# Patient Record
Sex: Male | Born: 1975 | Race: White | Hispanic: No | Marital: Married | Smoking: Former smoker
Health system: Southern US, Community
[De-identification: ages and names within clinical notes are randomized; demographics above are authoritative.]

## PROBLEM LIST (undated history)

## (undated) DIAGNOSIS — M5136 Other intervertebral disc degeneration, lumbar region: Secondary | ICD-10-CM

## (undated) DIAGNOSIS — Z8619 Personal history of other infectious and parasitic diseases: Secondary | ICD-10-CM

## (undated) HISTORY — DX: Personal history of other infectious and parasitic diseases: Z86.19

## (undated) HISTORY — PX: SKIN BIOPSY: SHX1

---

## 2010-03-18 ENCOUNTER — Ambulatory Visit: Payer: Self-pay | Admitting: Family Medicine

## 2010-09-18 NOTE — Assessment & Plan Note (Signed)
Summary: WELLNESS CHECK/EVM   Vital Signs:  Patient Profile:   35 Years Old Male CC:      wellness check Height:     71 inches Weight:      168 pounds Temp:     98.7 degrees F oral Pulse rate:   76 / minute Pulse rhythm:   regular Resp:     16 per minute BP sitting:   114 / 64  (left arm) Cuff size:   regular  Vitals Entered By: Providence Crosby LPN (March 18, 2010 1:03 PM)              Vision Screening: Left eye w/o correction: 20 / 25 Right Eye w/o correction: 20 / 20 Both eyes w/o correction:  20/ 20  Color vision testing: normal      Vision Entered By: Providence Crosby LPN (March 18, 2010 1:09 PM) Audiometry Screening     20db HL: Yes    Left  500 hz: 20db 1000 hz: 20db 2000 hz: 20db 4000 hz: 20db Right  500 hz: 20db 1000 hz: 20db 2000 hz: 20db 4000 hz: 20db   Hearing Testing Entered By: Providence Crosby LPN (March 18, 2010 1:09 PM)    Current Allergies: No known allergies History of Present Illness Reason for visit: wellness check Chief Complaint: wellness check History of Present Illness: wants physical only complaint is some left ear pain.  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 He works for [employer].  He feels well, eats healthy, stays active, and has good family history of good health.  REVIEW OF SYSTEMS Constitutional Symptoms      Denies fever, chills, night sweats, weight loss, weight gain, and fatigue.  Eyes       Denies change in vision, eye pain, eye discharge, glasses, contact lenses, and eye surgery. Ear/Nose/Throat/Mouth       Complains of ear pain.      Denies hearing loss/aids, change in hearing, ear discharge, dizziness, frequent runny nose, frequent nose bleeds, sinus problems, sore throat, hoarseness, and tooth pain or bleeding.      Comments: left ear pain at times, He had frequent infections as child. He is sensitive to loud noises. Respiratory       Denies dry cough, productive  cough, wheezing, shortness of breath, asthma, bronchitis, and emphysema/COPD.  Cardiovascular       Denies murmurs, chest pain, and tires easily with exhertion.    Gastrointestinal       Denies stomach pain, nausea/vomiting, diarrhea, constipation, blood in bowel movements, and indigestion. Genitourniary       Denies painful urination, kidney stones, and loss of urinary control. Neurological       Denies paralysis, seizures, and fainting/blackouts. Musculoskeletal       Denies muscle pain, joint pain, joint stiffness, decreased range of motion, redness, swelling, muscle weakness, and gout.  Skin       Denies bruising, unusual mles/lumps or sores, and hair/skin or nail changes.  Psych       Denies mood changes, temper/anger issues, anxiety/stress, speech problems, depression, and sleep problems.  Past History:  Past Medical History: Fractured left forearm as child, and no problems now.  Family History: Father:  alive and well at 41 yoa Mother:alive and well at 25 yoa  Siblings: 2 brothers Thayer Ohm 63 yoa alive and well: geoff age 5 alive and well 1 Sister 52 yoa alive and well  Social History: Marital Status: Married Children: none Occupation:  Psychologist, clinical Married Never Smoked Alcohol use-yes Drug use-no Regular exercise-yes, with work, and staying actice, walking dogs, etc. Smoking Status:  never Drug Use:  no Does Patient Exercise:  yes Physical Exam General appearance: well developed, well nourished, no acute distress Head: normocephalic, atraumatic Eyes: conjunctivae and lids normal Pupils: equal, round, reactive to light Ears: normal, no lesions or deformities on right. On left, small red inflamed area mid portion of canal superiorly-like mild pusture, with mild amout cerumen around it.  TM with scarring inferiorly. Nasal: mucosa pink, nonedematous, no septal deviation, turbinates normal Oral/Pharynx: tongue normal, posterior pharynx without erythema  or exudate. Good dental care, with fillings noted. Neck: neck supple,  trachea midline, no masses Thyroid: no nodules, masses, tenderness, or enlargement Chest/Lungs: no rales, wheezes, or rhonchi bilateral, breath sounds equal without effort Heart: regular rate and  rhythm, no murmur Abdomen: soft, non-tender without obvious organomegaly GU: normal male, no hernia. Normal testicles. Prostate exam deferred. Extremities: normal extremities Neurological: grossly intact and non-focal Back: no tenderness over musculature, straight leg raises negative bilaterally, deep tendon reflexes 2+ at achilles and patella Skin: no obvious rashes or lesions MSE: oriented to time, place, and person Assessment New Problems: EXAMINATION, ROUTINE MEDICAL (ICD-V70.0)     Patient Instructions: 1)  CMP, Lipid Panel,CBC to be done soon, fasting 2)  Urine-dip to do later today.  Orders Added: 1)  New Patient Level IV [99204]  The patient was informed that there is no on-call provider or services available at this clinic during off-hours (when the clinic is closed).  If the patient developed a problem or concern that required immediate attention, the patient was advised to go the the nearest available urgent care or emergency department for medical care.  The patient verbalized understanding.     It was clearly explained to the patient that this [facility] is not intended to be a primary care clinic.  The patient is always better served by the continuity of care and the provider/patient relationships developed with their dedicated primary care provider.  The patient was told to be sure to follow up as soon as possible with their primary care provider to discuss treatments received and to receive further examination and testing.  The patient verbalized understanding. The will f/u with PCP ASAP.   I have reviewed the above medical office visit documention, including diagnoses, history, medications, clinical  lists, orders and plan of care.   Rodney Langton, MD, FAAFP  March 19, 2010

## 2011-07-23 ENCOUNTER — Emergency Department (HOSPITAL_BASED_OUTPATIENT_CLINIC_OR_DEPARTMENT_OTHER)
Admission: EM | Admit: 2011-07-23 | Discharge: 2011-07-23 | Disposition: A | Discharge status: Home / Self Care | Payer: 59 | Attending: Emergency Medicine | Admitting: Emergency Medicine

## 2011-07-23 DIAGNOSIS — W57XXXA Bitten or stung by nonvenomous insect and other nonvenomous arthropods, initial encounter: Secondary | ICD-10-CM

## 2011-07-23 DIAGNOSIS — L03116 Cellulitis of left lower limb: Secondary | ICD-10-CM

## 2011-07-23 DIAGNOSIS — S90569A Insect bite (nonvenomous), unspecified ankle, initial encounter: Secondary | ICD-10-CM | POA: Insufficient documentation

## 2011-07-23 MED ORDER — DOXYCYCLINE HYCLATE 100 MG PO TABS
ORAL_TABLET | ORAL | Status: AC
  Administered 2011-07-23: 100 mg via ORAL
  Filled 2011-07-23: qty 1

## 2011-07-23 MED ORDER — DOXYCYCLINE HYCLATE 100 MG PO CAPS: 100.0000 mg | ORAL_CAPSULE | Freq: Two times a day (BID) | ORAL | Status: AC

## 2011-07-23 MED ORDER — DOXYCYCLINE HYCLATE 100 MG PO TABS
100.0000 mg | ORAL_TABLET | Freq: Once | ORAL | Status: AC
  Administered 2011-07-23: 100 mg via ORAL

## 2011-07-23 NOTE — ED Notes (Signed)
Pt reports he pulled a tick from his left thigh yesterday.  Left thigh red and tender to touch.

## 2011-07-23 NOTE — ED Provider Notes (Signed)
History     CSN: [id number] Arrival date & time: 07/23/2011  3:14 PM   First MD Initiated Contact with Patient 07/23/11 1511      Chief Complaint  Patient presents with  . Tick Removal    HPI The patient reports take by with removal of the tick by himself yesterday.  He presents today complaining of a small amount of redness surrounding the area with tenderness to the touch and warmth.  He feels certain he got all the tick however he does note a small black area in the middle.  His had a fever and chills he is otherwise a healthy male.  Nothing worsens the symptoms nothing improves his symptoms.    History reviewed. No pertinent past medical history.  History reviewed. No pertinent past surgical history.  No family history on file.  History  Substance Use Topics  . Smoking status: Never Smoker   . Smokeless tobacco: Never Used  . Alcohol Use: Yes     weekly      Review of Systems  All other systems reviewed and are negative.    Allergies  Review of patient's allergies indicates no known allergies.  Home Medications   Current Outpatient Rx  Name Route Sig Dispense Refill  . DOXYCYCLINE HYCLATE 100 MG PO CAPS Oral Take 1 capsule (100 mg total) by mouth 2 (two) times daily. 14 capsule 0    BP 138/89  Pulse 78  Temp(Src) 98.7 F (37.1 C) (Oral)  Resp 16  Ht 6' (1.829 m)  Wt 175 lb (79.379 kg)  BMI 23.73 kg/m2  SpO2 100%  Physical Exam  Constitutional: He is oriented to person, place, and time. He appears well-developed and well-nourished.  HENT:  Head: Normocephalic.  Eyes: EOM are normal.  Neck: Normal range of motion.  Pulmonary/Chest: Effort normal.  Musculoskeletal: Normal range of motion.       Proximally 4 cm x 2 cm area of erythema on his left mid anterior medial thigh.  There is a small area of black in the middle that appears to be the head of the tick which was removed without difficulty.  Is no fluctuance or drainage from the area.  There is a  small amount of warmth and tenderness to this area.  It was outlined with a skin marker  Neurological: He is alert and oriented to person, place, and time.  Psychiatric: He has a normal mood and affect.    ED Course  Procedures (including critical care time)  Labs Reviewed - No data to display No results found.   1. Tick bite   2. Cellulitis of left lower extremity       MDM  I removed what appeared to be the tick head.  The patient was given doxycycline in the emergency department.  He'll be treated with a week's worth of doxycycline for suspected cellulitis.  He has no symptoms of RMSF at this time,. However doxy should cover that as well.         Lyanne Co, MD 07/23/11 [phone]

## 2012-03-05 ENCOUNTER — Encounter: Payer: Self-pay | Admitting: Internal Medicine

## 2012-03-05 ENCOUNTER — Ambulatory Visit (INDEPENDENT_AMBULATORY_CARE_PROVIDER_SITE_OTHER): Payer: 59 | Admitting: Internal Medicine

## 2012-03-05 VITALS — BP 102/82 | HR 76 | Temp 98.3°F | Ht 72.0 in | Wt 173.0 lb

## 2012-03-05 DIAGNOSIS — R6882 Decreased libido: Secondary | ICD-10-CM

## 2012-03-05 DIAGNOSIS — R1013 Epigastric pain: Secondary | ICD-10-CM

## 2012-03-05 DIAGNOSIS — Z13220 Encounter for screening for lipoid disorders: Secondary | ICD-10-CM

## 2012-03-05 DIAGNOSIS — G4700 Insomnia, unspecified: Secondary | ICD-10-CM

## 2012-03-05 DIAGNOSIS — Z8669 Personal history of other diseases of the nervous system and sense organs: Secondary | ICD-10-CM

## 2012-03-05 DIAGNOSIS — K3189 Other diseases of stomach and duodenum: Secondary | ICD-10-CM

## 2012-03-05 NOTE — Patient Instructions (Addendum)
I recommend a trial of OTC prilosec (omeprazole) once daily in the morning when you get up for your gastritis  Try taking Lactase with any meal containing dairy .  Return in a fasting state for your labs. (testoserone, lipids. CMT. CBC TSH)
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
## 2012-03-07 ENCOUNTER — Encounter: Payer: Self-pay | Admitting: Internal Medicine

## 2012-03-07 DIAGNOSIS — G4700 Insomnia, unspecified: Secondary | ICD-10-CM | POA: Insufficient documentation

## 2012-03-07 DIAGNOSIS — R1013 Epigastric pain: Secondary | ICD-10-CM | POA: Insufficient documentation

## 2012-03-07 DIAGNOSIS — Z8669 Personal history of other diseases of the nervous system and sense organs: Secondary | ICD-10-CM | POA: Insufficient documentation

## 2012-03-07 DIAGNOSIS — R6882 Decreased libido: Secondary | ICD-10-CM | POA: Insufficient documentation

## 2012-03-07 NOTE — Assessment & Plan Note (Addendum)
Etiology may be food intolerances.  Discussed possibility of lactose intolerance  and gluten sensitivity.  uecommended trial of omeprazole 20 mg daily  and use of lactase with any meals containing dairy.

## 2012-03-07 NOTE — Assessment & Plan Note (Signed)
Records not available but it sounds like he may have been treated for otitis externa prednisone and topical ointment. Examination of the tympanic membrane today shows some scarring but no evidence of erythema or effusion.

## 2012-03-07 NOTE — Assessment & Plan Note (Signed)
Etiology unclear; he has a history of borderline low testosterone in the past with no followup.. Checking thyroid and testosterone levels and  liver function.

## 2012-03-07 NOTE — Assessment & Plan Note (Addendum)
Chronic, now managed with melatonin 10 mg daily. He is following all good sleep hygiene rules. There is no history of snoring. No changes today.

## 2012-03-09 ENCOUNTER — Other Ambulatory Visit (INDEPENDENT_AMBULATORY_CARE_PROVIDER_SITE_OTHER): Payer: 59 | Admitting: *Deleted

## 2012-03-09 DIAGNOSIS — K3189 Other diseases of stomach and duodenum: Secondary | ICD-10-CM

## 2012-03-09 DIAGNOSIS — R6882 Decreased libido: Secondary | ICD-10-CM

## 2012-03-09 DIAGNOSIS — Z13220 Encounter for screening for lipoid disorders: Secondary | ICD-10-CM

## 2012-03-09 DIAGNOSIS — R1013 Epigastric pain: Secondary | ICD-10-CM

## 2012-03-09 LAB — COMPREHENSIVE METABOLIC PANEL
Albumin: 4.6 g/dL (ref 3.5–5.2)
BUN: 14 mg/dL (ref 6–23)
Calcium: 9.2 mg/dL (ref 8.4–10.5)
Chloride: 102 mEq/L (ref 96–112)
Glucose, Bld: 85 mg/dL (ref 70–99)
Potassium: 4.8 mEq/L (ref 3.5–5.3)

## 2012-03-09 LAB — LIPID PANEL
Cholesterol: 109 mg/dL (ref 0–200)
Triglycerides: 39 mg/dL (ref <150)
VLDL: 8 mg/dL (ref 0–40)

## 2012-03-09 LAB — CBC WITH DIFFERENTIAL/PLATELET
Hemoglobin: 15.6 g/dL (ref 13.0–17.0)
Lymphs Abs: 1.5 10*3/uL (ref 0.7–4.0)
Monocytes Relative: 9 % (ref 3–12)
Neutro Abs: 3.6 10*3/uL (ref 1.7–7.7)
Neutrophils Relative %: 64 % (ref 43–77)
RBC: 5.12 MIL/uL (ref 4.22–5.81)
WBC: 5.7 10*3/uL (ref 4.0–10.5)

## 2012-03-10 LAB — TESTOSTERONE, FREE, TOTAL, SHBG
Sex Hormone Binding: 18 nmol/L (ref 13–71)
Testosterone, Free: 97 pg/mL (ref 47.0–244.0)

## 2012-03-23 ENCOUNTER — Telehealth: Payer: Self-pay | Admitting: Internal Medicine

## 2012-03-23 NOTE — Telephone Encounter (Signed)
Patient is calling to check on his h.pylori and testosterone results that were pending when I called him on 7/23.  Please advise.

## 2012-03-23 NOTE — Telephone Encounter (Signed)
Pt called to check on labs results

## 2012-03-23 NOTE — Telephone Encounter (Signed)
His H pylori was negative and his testosterone levels were normal.  wi will try to e mail them later

## 2012-03-31 ENCOUNTER — Telehealth: Payer: Self-pay | Admitting: Internal Medicine

## 2012-03-31 NOTE — Telephone Encounter (Signed)
Patient notified of results.

## 2012-03-31 NOTE — Telephone Encounter (Signed)
Pt called wants to get results of his last labs Please advise

## 2012-05-05 ENCOUNTER — Encounter: Payer: Self-pay | Admitting: Internal Medicine

## 2012-10-03 ENCOUNTER — Other Ambulatory Visit: Payer: Self-pay

## 2013-04-30 ENCOUNTER — Encounter: Payer: Self-pay | Admitting: Internal Medicine

## 2013-04-30 ENCOUNTER — Ambulatory Visit (INDEPENDENT_AMBULATORY_CARE_PROVIDER_SITE_OTHER): Payer: 59 | Admitting: Internal Medicine

## 2013-04-30 VITALS — BP 122/76 | HR 88 | Temp 98.4°F | Resp 14 | Ht 72.0 in | Wt 172.5 lb

## 2013-04-30 DIAGNOSIS — S90852A Superficial foreign body, left foot, initial encounter: Secondary | ICD-10-CM

## 2013-04-30 DIAGNOSIS — M7020 Olecranon bursitis, unspecified elbow: Secondary | ICD-10-CM

## 2013-04-30 DIAGNOSIS — M25529 Pain in unspecified elbow: Secondary | ICD-10-CM

## 2013-04-30 DIAGNOSIS — M7021 Olecranon bursitis, right elbow: Secondary | ICD-10-CM

## 2013-04-30 DIAGNOSIS — IMO0002 Reserved for concepts with insufficient information to code with codable children: Secondary | ICD-10-CM

## 2013-04-30 DIAGNOSIS — G8929 Other chronic pain: Secondary | ICD-10-CM

## 2013-04-30 MED ORDER — TRAMADOL HCL 50 MG PO TABS: 50.0000 mg | ORAL_TABLET | Freq: Three times a day (TID) | ORAL | Status: DC | PRN

## 2013-04-30 MED ORDER — CEPHALEXIN 500 MG PO CAPS: 500.0000 mg | ORAL_CAPSULE | Freq: Four times a day (QID) | ORAL | Status: DC

## 2013-04-30 MED ORDER — DICLOFENAC SODIUM ER 100 MG PO TB24: 100.0000 mg | ORAL_TABLET | Freq: Every day | ORAL | Status: DC

## 2013-04-30 NOTE — Patient Instructions (Signed)
voltaren once daily (instead of aleve and ibuprofren)  Tramadol every 8 hours as needed  X rays elbow    Soak foot in warm salt water for 15 minutes once or twice daily  keep wound covered  Start antibiotics if you develop any redness or discharge and call office to be worked in
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
## 2013-05-02 ENCOUNTER — Encounter: Payer: Self-pay | Admitting: Internal Medicine

## 2013-05-02 DIAGNOSIS — M7030 Other bursitis of elbow, unspecified elbow: Secondary | ICD-10-CM | POA: Insufficient documentation

## 2013-05-02 DIAGNOSIS — S90852A Superficial foreign body, left foot, initial encounter: Secondary | ICD-10-CM | POA: Insufficient documentation

## 2013-05-02 NOTE — Assessment & Plan Note (Signed)
No specific trigger,  With prior remote injury dure to traumatic blow.  Plain films ,  NSAIDs .  If no improvement steroid injection

## 2013-05-02 NOTE — Assessment & Plan Note (Signed)
Patient reports persistent pain and irritation on the first metatarsal head after donning a sock that his labrador had chewed on and was concerned that he had a dog hair (or whisker) embedded in skin. Area was incised with scalpel and probed   No foreign body was found.,  patient instructed to soak foot in warm salt water and keep covered.  rx for keflex given for signs of redness, discharge indicating infection

## 2013-05-03 ENCOUNTER — Encounter: Payer: Self-pay | Admitting: Internal Medicine

## 2013-05-03 ENCOUNTER — Ambulatory Visit (INDEPENDENT_AMBULATORY_CARE_PROVIDER_SITE_OTHER)
Admission: RE | Admit: 2013-05-03 | Discharge: 2013-05-03 | Disposition: A | Discharge status: Home / Self Care | Payer: 59 | Source: Ambulatory Visit | Attending: Internal Medicine | Admitting: Internal Medicine

## 2013-05-03 DIAGNOSIS — G8929 Other chronic pain: Secondary | ICD-10-CM

## 2013-05-03 DIAGNOSIS — M25529 Pain in unspecified elbow: Secondary | ICD-10-CM

## 2013-05-05 NOTE — Telephone Encounter (Signed)
Mailed unread message to pt  

## 2013-06-24 ENCOUNTER — Other Ambulatory Visit: Payer: Self-pay

## 2014-09-30 ENCOUNTER — Ambulatory Visit (INDEPENDENT_AMBULATORY_CARE_PROVIDER_SITE_OTHER)
Admission: RE | Admit: 2014-09-30 | Discharge: 2014-09-30 | Disposition: A | Discharge status: Home / Self Care | Payer: Managed Care, Other (non HMO) | Source: Ambulatory Visit | Attending: Nurse Practitioner | Admitting: Nurse Practitioner

## 2014-09-30 ENCOUNTER — Encounter: Payer: Self-pay | Admitting: Nurse Practitioner

## 2014-09-30 ENCOUNTER — Ambulatory Visit (INDEPENDENT_AMBULATORY_CARE_PROVIDER_SITE_OTHER): Payer: Managed Care, Other (non HMO) | Admitting: Nurse Practitioner

## 2014-09-30 ENCOUNTER — Ambulatory Visit (HOSPITAL_COMMUNITY): Payer: Managed Care, Other (non HMO)

## 2014-09-30 VITALS — BP 132/88 | HR 82 | Temp 97.8°F | Resp 12 | Ht 72.0 in | Wt 177.8 lb

## 2014-09-30 DIAGNOSIS — M5416 Radiculopathy, lumbar region: Secondary | ICD-10-CM

## 2014-09-30 MED ORDER — GABAPENTIN 300 MG PO CAPS: 300.0000 mg | ORAL_CAPSULE | Freq: Every day | ORAL | Status: DC

## 2014-09-30 NOTE — Assessment & Plan Note (Signed)
Stable. Pt would like to continue the ibuprofen instead of something like Meloxicam or diclofenac. Rx for gabapentin 300 mg at night. [facility] FU in 2 weeks.

## 2014-09-30 NOTE — Progress Notes (Signed)
Pre visit review using our clinic review tool, if applicable. No additional management support is needed unless otherwise documented below in the visit note. 

## 2014-09-30 NOTE — Patient Instructions (Signed)
Follow-up in 2 weeks

## 2014-09-30 NOTE — Progress Notes (Signed)
Subjective:    Patient ID: Colton Morris, male    DOB: 09-Aug-1976, 39 y.o.   MRN: [id number]  HPI  Colton Morris is a 5 male with a CC of back pain x 1 month.    1) Back pain stable since approx. 15 years.  Splitting fire wood in November felt pain on left.  Month ago felt pain going down left leg   Shooting pain down left leg and constant  Pain on left leg is posterior and lateral down to posterior heel and stops Relieving- Bending leg, lying down  Aggrevating- standing and sitting more than 30 min.  Stretching- Helpful Massages- Helpful Foot elevated - Helpful  Ibuprofen- helpful somewhat    Review of Systems  Constitutional: Negative for fever, chills, diaphoresis, activity change and fatigue.  Eyes: Negative for visual disturbance.  Gastrointestinal: Negative for nausea, vomiting, diarrhea and rectal pain.  Genitourinary: Negative for dysuria and difficulty urinating.  Musculoskeletal: Positive for back pain and arthralgias. Negative for myalgias, joint swelling, gait problem, neck pain and neck stiffness.       Lower back bilateral, left side worse, pain down left leg  Skin: Negative for rash.  Neurological: Negative for dizziness, weakness and numbness.       Denies losing bowel/bladder function, saddle anesthesia, or weakness of extremities.    Past Medical History  Diagnosis Date  . History of chicken pox     History   Social History  . Marital Status: Married    Spouse Name: N/A  . Number of Children: N/A  . Years of Education: 16   Occupational History  . Facility/Property Manager Other   Social History Main Topics  . Smoking status: Never Smoker   . Smokeless tobacco: Never Used  . Alcohol Use: 3.0 oz/week    6 drink(s) per week     Comment: weekly  . Drug Use: No  . Sexual Activity: Yes   Other Topics Concern  . Not on file   Social History Narrative   Regular exercise-yes   Caffeine Use-yes          No past surgical history on  file.  Family History  Problem Relation Age of Onset  . Cancer Neg Hx     No Known Allergies  No current outpatient prescriptions on file prior to visit.   No current facility-administered medications on file prior to visit.       Objective:   Physical Exam  Constitutional: He is oriented to person, place, and time. He appears well-developed and well-nourished. No distress.  BP 132/88 mmHg  Pulse 82  Temp(Src) 97.8 F (36.6 C) (Oral)  Resp 12  Ht 6' (1.829 m)  Wt 177 lb 12.8 oz (80.65 kg)  BMI 24.11 kg/m2  SpO2 97%   HENT:  Head: Normocephalic and atraumatic.  Right Ear: External ear normal.  Left Ear: External ear normal.  Eyes: Right eye exhibits no discharge. Left eye exhibits no discharge. No scleral icterus.  Cardiovascular: Normal rate, regular rhythm, normal heart sounds and intact distal pulses.  Exam reveals no gallop and no friction rub.   No murmur heard. Pulmonary/Chest: Effort normal and breath sounds normal. No respiratory distress. He has no wheezes. He has no rales. He exhibits no tenderness.  Musculoskeletal: Normal range of motion. He exhibits tenderness. He exhibits no edema.  Left lower back L4 area paraspinal muscle tenderness  Neurological: He is alert and oriented to person, place, and time. He displays normal reflexes. No cranial  nerve deficit. He exhibits normal muscle tone. Coordination normal.  Negative straight leg raise left   Skin: Skin is warm and dry. No rash noted. He is not diaphoretic.  Psychiatric: He has a normal mood and affect. His behavior is normal. Judgment and thought content normal.      Assessment & Plan:

## 2014-10-05 ENCOUNTER — Telehealth: Payer: Self-pay | Admitting: Internal Medicine

## 2014-10-05 NOTE — Telephone Encounter (Signed)
Patient calling for results of X-Ray from 09/30/14 please advise?

## 2014-10-05 NOTE — Telephone Encounter (Signed)
There is some disc space narrowing between the last lumbar vertebrae and first sacral vertebrae. MRI would be a good next step if still having pain. This would show nerves being pinched or discs bulging whereas we can't see this on the X-ray. X-ray is always the first step.

## 2014-10-05 NOTE — Telephone Encounter (Signed)
Notified patient of results and patient states he continues to be pain and would like to proceed with MRI.

## 2014-10-06 ENCOUNTER — Other Ambulatory Visit: Payer: Self-pay | Admitting: Nurse Practitioner

## 2014-10-06 DIAGNOSIS — M5416 Radiculopathy, lumbar region: Secondary | ICD-10-CM

## 2014-10-06 NOTE — Telephone Encounter (Signed)
Thanks! Putting it in for MRI of lumbar spine.

## 2014-10-07 ENCOUNTER — Encounter: Payer: Self-pay | Admitting: Internal Medicine

## 2014-10-14 ENCOUNTER — Ambulatory Visit: Payer: Managed Care, Other (non HMO) | Admitting: Internal Medicine

## 2014-10-19 ENCOUNTER — Ambulatory Visit: Payer: Self-pay | Admitting: Internal Medicine

## 2014-10-24 ENCOUNTER — Telehealth: Payer: Self-pay | Admitting: Internal Medicine

## 2014-10-24 NOTE — Telephone Encounter (Signed)
Notified patient. Pt verbalized understanding.  Appointment made with Lyla Sonarrie 3/14 to follow up.

## 2014-10-24 NOTE — Telephone Encounter (Signed)
Carries patient had MRI done last week and there is a disk bulge at L5 that is pressing on the S1 and S2 nerve roots.  plesae ask him to follow up with her regarding treatment /referral

## 2014-10-31 ENCOUNTER — Encounter: Payer: Self-pay | Admitting: Nurse Practitioner

## 2014-10-31 ENCOUNTER — Ambulatory Visit (INDEPENDENT_AMBULATORY_CARE_PROVIDER_SITE_OTHER): Payer: Managed Care, Other (non HMO) | Admitting: Nurse Practitioner

## 2014-10-31 VITALS — BP 118/76 | HR 76 | Temp 98.1°F | Resp 12 | Ht 73.0 in | Wt 176.4 lb

## 2014-10-31 DIAGNOSIS — M5416 Radiculopathy, lumbar region: Secondary | ICD-10-CM

## 2014-10-31 NOTE — Progress Notes (Signed)
Subjective:    Patient ID: Colton Morris, male    DOB: [birth year], 39 y.o.   MRN: [id number]  HPI  Colton Morris is a 39 yo male with a CC of lumbar pain with radiculopathy to left side. He is here to discuss treatment options.   1) X-ray shows L5 S1 disc space narrowing.   MRI shows disk bulge at L5 pressing on S1 and S2 nerve roots. He is not interested in taking pain medications or steroids.  Gabapentin helpful- wears off and wakes up at night  Pain worse when on feet after a few hours   Review of Systems  Constitutional: Negative for fever, chills, diaphoresis, activity change and fatigue.  Eyes: Negative for visual disturbance.  Gastrointestinal: Negative for nausea, vomiting and diarrhea.  Genitourinary: Negative for dysuria and difficulty urinating.  Musculoskeletal: Positive for back pain and arthralgias. Negative for myalgias, joint swelling, gait problem, neck pain and neck stiffness.  Skin: Negative for rash.  Neurological: Negative for dizziness, weakness and numbness.   Past Medical History  Diagnosis Date  . History of chicken pox     History   Social History  . Marital Status: Married    Spouse Name: N/A  . Number of Children: N/A  . Years of Education: 16   Occupational History  . Facility/Property Manager Other   Social History Main Topics  . Smoking status: Never Smoker   . Smokeless tobacco: Never Used  . Alcohol Use: 3.0 oz/week    6 drink(s) per week     Comment: weekly  . Drug Use: No  . Sexual Activity: Yes   Other Topics Concern  . Not on file   Social History Narrative   Regular exercise-yes   Caffeine Use-yes          No past surgical history on file.  Family History  Problem Relation Age of Onset  . Cancer Neg Hx     No Known Allergies  Current Outpatient Prescriptions on File Prior to Visit  Medication Sig Dispense Refill  . gabapentin (NEURONTIN) 300 MG capsule Take 1 capsule (300 mg total) by mouth at bedtime. 90  capsule 2   No current facility-administered medications on file prior to visit.      Objective:   Physical Exam  Constitutional: He is oriented to person, place, and time. He appears well-developed and well-nourished. No distress.  BP 118/76 mmHg  Pulse 76  Temp(Src) 98.1 F (36.7 C) (Oral)  Resp 12  Ht 6\' 1"  (1.854 m)  Wt 176 lb 6.4 oz (80.015 kg)  BMI 23.28 kg/m2  SpO2 98%   HENT:  Head: Normocephalic and atraumatic.  Right Ear: External ear normal.  Left Ear: External ear normal.  Eyes: Right eye exhibits no discharge. Left eye exhibits no discharge. No scleral icterus.  Cardiovascular: Normal rate, regular rhythm, normal heart sounds and intact distal pulses.  Exam reveals no gallop and no friction rub.   No murmur heard. Pulmonary/Chest: Effort normal and breath sounds normal. No respiratory distress. He has no wheezes. He has no rales. He exhibits no tenderness.  Musculoskeletal: Normal range of motion. He exhibits tenderness. He exhibits no edema.  Neurological: He is alert and oriented to person, place, and time. He displays normal reflexes. No cranial nerve deficit. He exhibits normal muscle tone. Coordination normal.  Skin: Skin is warm and dry. No rash noted. He is not diaphoretic.  Psychiatric: He has a normal mood and affect. His behavior is normal. Judgment  and thought content normal.      Assessment & Plan:

## 2014-10-31 NOTE — Assessment & Plan Note (Signed)
Stable. We discussed future options such as oral steroids, ESIs, physical therapy, referral to neuro or ortho spine for further work up and or conservative therapy + surgical consult. At this time he would like to think about his options before choosing. He will let us know what he decides. He is leaning towards neuro consult with conservative therapy measures. Will follow.

## 2014-10-31 NOTE — Progress Notes (Signed)
Pre visit review using our clinic review tool, if applicable. No additional management support is needed unless otherwise documented below in the visit note. 

## 2014-10-31 NOTE — Patient Instructions (Signed)
[facility]  [place], [place]  Let me know if you would like a referral to any that we discussed.

## 2014-11-28 ENCOUNTER — Encounter: Payer: Self-pay | Admitting: Internal Medicine

## 2014-12-12 ENCOUNTER — Telehealth: Payer: Self-pay

## 2014-12-12 ENCOUNTER — Other Ambulatory Visit: Payer: Self-pay | Admitting: Nurse Practitioner

## 2014-12-12 DIAGNOSIS — M5416 Radiculopathy, lumbar region: Secondary | ICD-10-CM

## 2014-12-12 NOTE — Telephone Encounter (Signed)
Please advise looks like saw NP on 10/31/14 for referral.?

## 2014-12-12 NOTE — Telephone Encounter (Signed)
I cannot find a note where he called and said go ahead with referral. I believe that is why it was not done. My last note says that he would decide and call us back. I placed a referral to [facility] just now.

## 2014-12-12 NOTE — Telephone Encounter (Signed)
The patient called and stated he believes he is suppose to have a referral to a neurologist.  Do you want him to be referred?

## 2014-12-14 NOTE — Telephone Encounter (Signed)
Left message for patient to return call to office. 

## 2014-12-19 ENCOUNTER — Other Ambulatory Visit: Payer: Self-pay | Admitting: Nurse Practitioner

## 2014-12-19 MED ORDER — HYDROCODONE-ACETAMINOPHEN 5-325 MG PO TABS: 1.0000 | ORAL_TABLET | Freq: Four times a day (QID) | ORAL | Status: DC | PRN

## 2014-12-19 NOTE — Telephone Encounter (Signed)
Patient stated that if pain continues will go to ER.

## 2014-12-19 NOTE — Telephone Encounter (Signed)
Not a problem. I am comfortable with this. Will print a script for him that he can pick up tonight if possible. It takes awhile to get into [facility] so I will help in the mean time.

## 2014-12-19 NOTE — Telephone Encounter (Signed)
I have not seen this patient in 2 years.  Lyla SonCarrie if you are comfortable prescribing something stronger for pian I n the  interim, since you have seen him twice in the last 3 months, go ahead.  But I can't without seeing him.

## 2014-12-19 NOTE — Telephone Encounter (Signed)
Patient stated he does not care where but he needs some relief his pain at times is a 10 on scale 1 to 10 patient stated that has a bulging disk which stated is running down right leg and at times leg is numb.

## 2015-01-06 ENCOUNTER — Other Ambulatory Visit: Payer: Self-pay | Admitting: Neurosurgery

## 2015-01-06 DIAGNOSIS — M5137 Other intervertebral disc degeneration, lumbosacral region: Secondary | ICD-10-CM

## 2015-01-12 ENCOUNTER — Other Ambulatory Visit: Payer: Self-pay | Admitting: Neurosurgery

## 2015-01-12 ENCOUNTER — Ambulatory Visit
Admission: RE | Admit: 2015-01-12 | Discharge: 2015-01-12 | Disposition: A | Discharge status: Home / Self Care | Payer: Managed Care, Other (non HMO) | Source: Ambulatory Visit | Attending: Neurosurgery | Admitting: Neurosurgery

## 2015-01-12 DIAGNOSIS — M5137 Other intervertebral disc degeneration, lumbosacral region: Secondary | ICD-10-CM

## 2015-01-12 MED ORDER — IOHEXOL 180 MG/ML  SOLN
1.0000 mL | Freq: Once | INTRAMUSCULAR | Status: AC | PRN
  Administered 2015-01-12: 1 mL via EPIDURAL

## 2015-01-12 MED ORDER — METHYLPREDNISOLONE ACETATE 40 MG/ML INJ SUSP (RADIOLOG
120.0000 mg | Freq: Once | INTRAMUSCULAR | Status: AC
  Administered 2015-01-12: 120 mg via EPIDURAL

## 2015-01-12 NOTE — Discharge Instructions (Signed)

## 2015-01-30 ENCOUNTER — Ambulatory Visit: Payer: Self-pay | Admitting: Anesthesiology

## 2015-01-31 ENCOUNTER — Other Ambulatory Visit: Payer: Self-pay | Admitting: Neurosurgery

## 2015-01-31 DIAGNOSIS — M5137 Other intervertebral disc degeneration, lumbosacral region: Secondary | ICD-10-CM

## 2015-02-07 ENCOUNTER — Ambulatory Visit
Admission: RE | Admit: 2015-02-07 | Discharge: 2015-02-07 | Disposition: A | Discharge status: Home / Self Care | Payer: Managed Care, Other (non HMO) | Source: Ambulatory Visit | Attending: Neurosurgery | Admitting: Neurosurgery

## 2015-02-07 DIAGNOSIS — M5137 Other intervertebral disc degeneration, lumbosacral region: Secondary | ICD-10-CM

## 2015-02-07 MED ORDER — IOHEXOL 180 MG/ML  SOLN
1.0000 mL | Freq: Once | INTRAMUSCULAR | Status: AC | PRN
  Administered 2015-02-07: 1 mL via EPIDURAL

## 2015-02-07 MED ORDER — METHYLPREDNISOLONE ACETATE 40 MG/ML INJ SUSP (RADIOLOG
120.0000 mg | Freq: Once | INTRAMUSCULAR | Status: AC
  Administered 2015-02-07: 120 mg via EPIDURAL

## 2015-03-03 ENCOUNTER — Encounter (HOSPITAL_BASED_OUTPATIENT_CLINIC_OR_DEPARTMENT_OTHER): Payer: Self-pay

## 2015-03-03 ENCOUNTER — Emergency Department (HOSPITAL_BASED_OUTPATIENT_CLINIC_OR_DEPARTMENT_OTHER)
Admission: EM | Admit: 2015-03-03 | Discharge: 2015-03-03 | Disposition: A | Discharge status: Home / Self Care | Payer: Worker's Compensation | Attending: Emergency Medicine | Admitting: Emergency Medicine

## 2015-03-03 DIAGNOSIS — Z23 Encounter for immunization: Secondary | ICD-10-CM | POA: Insufficient documentation

## 2015-03-03 DIAGNOSIS — Y9389 Activity, other specified: Secondary | ICD-10-CM | POA: Diagnosis not present

## 2015-03-03 DIAGNOSIS — Y9289 Other specified places as the place of occurrence of the external cause: Secondary | ICD-10-CM | POA: Insufficient documentation

## 2015-03-03 DIAGNOSIS — Y990 Civilian activity done for income or pay: Secondary | ICD-10-CM | POA: Insufficient documentation

## 2015-03-03 DIAGNOSIS — Z8619 Personal history of other infectious and parasitic diseases: Secondary | ICD-10-CM | POA: Insufficient documentation

## 2015-03-03 DIAGNOSIS — S61219A Laceration without foreign body of unspecified finger without damage to nail, initial encounter: Secondary | ICD-10-CM

## 2015-03-03 DIAGNOSIS — Z8739 Personal history of other diseases of the musculoskeletal system and connective tissue: Secondary | ICD-10-CM | POA: Insufficient documentation

## 2015-03-03 DIAGNOSIS — S61215A Laceration without foreign body of left ring finger without damage to nail, initial encounter: Secondary | ICD-10-CM | POA: Insufficient documentation

## 2015-03-03 DIAGNOSIS — Y288XXA Contact with other sharp object, undetermined intent, initial encounter: Secondary | ICD-10-CM | POA: Diagnosis not present

## 2015-03-03 HISTORY — DX: Other intervertebral disc degeneration, lumbar region: M51.36

## 2015-03-03 MED ORDER — LIDOCAINE HCL 2 % IJ SOLN
INTRAMUSCULAR | Status: AC
  Filled 2015-03-03: qty 20

## 2015-03-03 MED ORDER — LIDOCAINE HCL (PF) 1 % IJ SOLN
30.0000 mL | Freq: Once | INTRAMUSCULAR | Status: AC
  Administered 2015-03-03: 30 mL
  Filled 2015-03-03: qty 30

## 2015-03-03 MED ORDER — IBUPROFEN 400 MG PO TABS
600.0000 mg | ORAL_TABLET | Freq: Once | ORAL | Status: AC
  Administered 2015-03-03: 600 mg via ORAL
  Filled 2015-03-03 (×2): qty 1

## 2015-03-03 MED ORDER — TETANUS-DIPHTH-ACELL PERTUSSIS 5-2.5-18.5 LF-MCG/0.5 IM SUSP
0.5000 mL | Freq: Once | INTRAMUSCULAR | Status: AC
  Administered 2015-03-03: 0.5 mL via INTRAMUSCULAR
  Filled 2015-03-03: qty 0.5

## 2015-03-03 NOTE — Discharge Instructions (Signed)
Fingertip Injuries and Amputations °Fingertip injuries are common and often get injured because they are last to escape when pulling your hand out of harm's way. You have amputated (cut off) part of your finger. How this turns out depends largely on how much was amputated. If just the tip is amputated, often the end of the finger will grow back and the finger may return to much the same as it was before the injury.  °If more of the finger is missing, your caregiver has done the best with the tissue remaining to allow you to keep as much finger as is possible. Your caregiver after checking your injury has tried to leave you with a painless fingertip that has durable, feeling skin. If possible, your caregiver has tried to maintain the finger's length and appearance and preserve its fingernail.  °Please read the instructions outlined below and refer to this sheet in the next few weeks. These instructions provide you with general information on caring for yourself. Your caregiver may also give you specific instructions. While your treatment has been done according to the most current medical practices available, unavoidable complications occasionally occur. If you have any problems or questions after discharge, please call your caregiver. °HOME CARE INSTRUCTIONS  °· You may resume normal diet and activities as directed or allowed. °· Keep your hand elevated above the level of your heart. This helps decrease pain and swelling. °· Keep ice packs (or a bag of ice wrapped in a towel) on the injured area for 15-20 minutes, 03-04 times per day, for the first two days. °· Change dressings if necessary or as directed. °· Clean the wound daily or as directed. °· Only take over-the-counter or prescription medicines for pain, discomfort, or fever as directed by your caregiver. °· Keep appointments as directed. °SEEK IMMEDIATE MEDICAL CARE IF: °· You develop redness, swelling, numbness or increasing pain in the wound. °· There is  pus coming from the wound. °· You develop an unexplained oral temperature above 102° F (38.9° C) or as your caregiver suggests. °· There is a foul (bad) smell coming from the wound or dressing. °· There is a breaking open of the wound (edges not staying together) after sutures or staples have been removed. °MAKE SURE YOU:  °· Understand these instructions. °· Will watch your condition. °· Will get help right away if you are not doing well or get worse. °Document Released: 06/26/2005 Document Revised: 10/28/2011 Document Reviewed: 05/25/2008 °ExitCare® Patient Information ©2015 [employer]. This information is not intended to replace advice given to you by your health care provider. Make sure you discuss any questions you have with your health care provider. ° °

## 2015-03-03 NOTE — ED Notes (Signed)
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

## 2015-03-03 NOTE — ED Provider Notes (Signed)
CSN: [id number]     Arrival date & time 03/03/15  0910 History   First MD Initiated Contact with Patient 03/03/15 1002     Chief Complaint  Patient presents with  . Extremity Laceration     (Consider location/radiation/quality/duration/timing/severity/associated sxs/prior Treatment) Patient is a 39 y.o. male presenting with skin laceration. The history is provided by the patient.  Laceration Location:  Finger Finger laceration location:  L ring finger Length (cm):  3 Depth:  Through dermis Quality: straight   Bleeding: venous and controlled   Time since incident:  2 hours Laceration mechanism:  Metal edge Pain details:    Quality:  Throbbing   Severity:  Moderate   Timing:  Constant   Progression:  Unchanged   Past Medical History  Diagnosis Date  . History of chicken pox   . Disc degeneration, lumbar    History reviewed. No pertinent past surgical history. Family History  Problem Relation Age of Onset  . Cancer Neg Hx    History  Substance Use Topics  . Smoking status: Never Smoker   . Smokeless tobacco: Never Used  . Alcohol Use: 3.0 oz/week    6 Standard drinks or equivalent per week     Comment: weekly    Review of Systems  Constitutional: Negative for fever.  Respiratory: Negative for cough and shortness of breath.   All other systems reviewed and are negative.     Allergies  Review of patient's allergies indicates no known allergies.  Home Medications   Prior to Admission medications   Medication Sig Start Date End Date Taking? Authorizing Provider  gabapentin (NEURONTIN) 300 MG capsule Take 1 capsule (300 mg total) by mouth at bedtime. 09/30/14   Carollee Leitzarrie M Doss, NP  HYDROcodone-acetaminophen (NORCO/VICODIN) 5-325 MG per tablet Take 1 tablet by mouth every 6 (six) hours as needed for moderate pain. 12/19/14   Carollee Leitzarrie M Doss, NP   BP 120/84 mmHg  Pulse 72  Temp(Src) 97.8 F (36.6 C) (Oral)  Resp 18  Ht 5\' 11"  (1.803 m)  Wt 175 lb (79.379 kg)  BMI  24.42 kg/m2  SpO2 100% Physical Exam  Constitutional: He is oriented to person, place, and time. He appears well-developed and well-nourished. No distress.  HENT:  Head: Normocephalic and atraumatic.  Mouth/Throat: No oropharyngeal exudate.  Eyes: EOM are normal. Pupils are equal, round, and reactive to light.  Neck: Normal range of motion. Neck supple.  Cardiovascular: Normal rate and regular rhythm.  Exam reveals no friction rub.   No murmur heard. Pulmonary/Chest: Effort normal and breath sounds normal. No respiratory distress. He has no wheezes. He has no rales.  Abdominal: He exhibits no distension. There is no tenderness. There is no rebound.  Musculoskeletal: Normal range of motion. He exhibits no edema.       Hands: Neurological: He is alert and oriented to person, place, and time.  Skin: He is not diaphoretic.    ED Course  LACERATION REPAIR Date/Time: 03/03/2015 3:27 PM Performed by: Elwin MochaWALDEN, Gemini Beaumier Authorized by: Elwin MochaWALDEN, Persais Ethridge Consent: Verbal consent obtained. Body area: upper extremity Location details: left ring finger Laceration length: 3 cm Foreign bodies: no foreign bodies Tendon involvement: none Nerve involvement: none Anesthesia: digital block Local anesthetic: lidocaine 2% without epinephrine Anesthetic total: 6 ml Patient sedated: no Preparation: Patient was prepped and draped in the usual sterile fashion. Irrigation solution: saline Irrigation method: jet lavage Amount of cleaning: standard Debridement: none Degree of undermining: none Skin closure: 5-0 Prolene Number of sutures:  5 Technique: simple Approximation: close Approximation difficulty: simple Patient tolerance: Patient tolerated the procedure well with no immediate complications   (including critical care time) Labs Review Labs Reviewed - No data to display  Imaging Review No results found.   EKG Interpretation None      MDM   Final diagnoses:  Laceration of finger, left,  initial encounter    39 year old male here with laceration to left ring finger. Repair as above. Tetanus updated here. Neurovascular intact 4 and after the procedure. No concern for bony involvement. Patient well-appearing. Stable for discharge.    Elwin Mocha, MD 03/03/15 [phone]

## 2015-03-10 ENCOUNTER — Encounter (HOSPITAL_BASED_OUTPATIENT_CLINIC_OR_DEPARTMENT_OTHER): Payer: Self-pay

## 2015-03-10 ENCOUNTER — Emergency Department (HOSPITAL_BASED_OUTPATIENT_CLINIC_OR_DEPARTMENT_OTHER)
Admission: EM | Admit: 2015-03-10 | Discharge: 2015-03-10 | Disposition: A | Discharge status: Home / Self Care | Payer: Worker's Compensation | Attending: Emergency Medicine | Admitting: Emergency Medicine

## 2015-03-10 DIAGNOSIS — Z4802 Encounter for removal of sutures: Secondary | ICD-10-CM | POA: Diagnosis not present

## 2015-03-10 DIAGNOSIS — Z8739 Personal history of other diseases of the musculoskeletal system and connective tissue: Secondary | ICD-10-CM | POA: Insufficient documentation

## 2015-03-10 DIAGNOSIS — Z8619 Personal history of other infectious and parasitic diseases: Secondary | ICD-10-CM | POA: Diagnosis not present

## 2015-03-10 NOTE — ED Notes (Addendum)
Pt here for suture removal of left ring finger - sutures placed 7 days - wound appears to be healing well - redness noted - skin intact, pt denies fever, pain, excessive drainage or odor. Workers Occupational hygienist.

## 2015-03-10 NOTE — Discharge Instructions (Signed)

## 2015-03-10 NOTE — ED Provider Notes (Signed)
CSN: [id number]     Arrival date & time 03/10/15  0908 History   First MD Initiated Contact with Patient 03/10/15 [phone]     Chief Complaint  Patient presents with  . Suture / Staple Removal      HPI   She presents for evaluation for suture removal. Sutures placed in his left hand ring finger 7 days ago after he struck his finger against a sharp edge of sheet metal. His tetanus was updated that day.  Past Medical History  Diagnosis Date  . History of chicken pox   . Disc degeneration, lumbar    History reviewed. No pertinent past surgical history. Family History  Problem Relation Age of Onset  . Cancer Neg Hx    History  Substance Use Topics  . Smoking status: Never Smoker   . Smokeless tobacco: Never Used  . Alcohol Use: 3.0 oz/week    6 Standard drinks or equivalent per week     Comment: weekly    Review of Systems  Skin: Positive for wound.       No drainage no purulence noted erythema.      Allergies  Review of patient's allergies indicates no known allergies.  Home Medications   Prior to Admission medications   Medication Sig Start Date End Date Taking? Authorizing Provider  gabapentin (NEURONTIN) 300 MG capsule Take 1 capsule (300 mg total) by mouth at bedtime. 09/30/14  Yes Carollee Leitz, NP  HYDROcodone-acetaminophen (NORCO/VICODIN) 5-325 MG per tablet Take 1 tablet by mouth every 6 (six) hours as needed for moderate pain. 12/19/14  Yes Carollee Leitz, NP   BP 122/75 mmHg  Pulse 77  Temp(Src) 98.5 F (36.9 C) (Oral)  Resp 16  Ht  (1.803 m)  Wt 175 lb (79.379 kg)  BMI 24.42 kg/m2  SpO2 100% Physical Exam  Skin:  Sutured laceration to left ring finger volar P3. Some congestion of the distal flap wound appears intact. Sutures removed. Dressed. Discharged home.    ED Course  Procedures (including critical care time) Labs Review Labs Reviewed - No data to display  Imaging Review No results found.   EKG Interpretation None      MDM    Final diagnoses:  Visit for suture removal    Wound appears intact. Sutures removed. Dressed. Discharge. Given aftercare instructions.    Rolland Porter, MD 03/10/15 [phone]

## 2015-08-20 HISTORY — PX: BACK SURGERY: SHX140

## 2016-04-19 ENCOUNTER — Other Ambulatory Visit: Payer: Self-pay | Admitting: Neurosurgery

## 2016-04-19 DIAGNOSIS — M5137 Other intervertebral disc degeneration, lumbosacral region: Secondary | ICD-10-CM

## 2016-05-02 ENCOUNTER — Ambulatory Visit
Admission: RE | Admit: 2016-05-02 | Discharge: 2016-05-02 | Disposition: A | Discharge status: Home / Self Care | Payer: BLUE CROSS/BLUE SHIELD | Source: Ambulatory Visit | Attending: Neurosurgery | Admitting: Neurosurgery

## 2016-05-02 DIAGNOSIS — M4806 Spinal stenosis, lumbar region: Secondary | ICD-10-CM | POA: Diagnosis not present

## 2016-05-02 DIAGNOSIS — M5137 Other intervertebral disc degeneration, lumbosacral region: Secondary | ICD-10-CM

## 2016-05-02 DIAGNOSIS — M5126 Other intervertebral disc displacement, lumbar region: Secondary | ICD-10-CM | POA: Diagnosis not present

## 2016-05-02 MED ORDER — GADOBENATE DIMEGLUMINE 529 MG/ML IV SOLN
20.0000 mL | Freq: Once | INTRAVENOUS | Status: AC | PRN
  Administered 2016-05-02: 16 mL via INTRAVENOUS

## 2016-05-17 ENCOUNTER — Other Ambulatory Visit: Payer: Self-pay | Admitting: Neurosurgery

## 2016-05-17 DIAGNOSIS — M5126 Other intervertebral disc displacement, lumbar region: Secondary | ICD-10-CM

## 2016-05-30 ENCOUNTER — Telehealth: Payer: Self-pay | Admitting: Internal Medicine

## 2016-05-30 ENCOUNTER — Other Ambulatory Visit: Payer: Self-pay | Admitting: Neurosurgery

## 2016-05-30 ENCOUNTER — Ambulatory Visit
Admission: RE | Admit: 2016-05-30 | Discharge: 2016-05-30 | Disposition: A | Discharge status: Home / Self Care | Payer: BLUE CROSS/BLUE SHIELD | Source: Ambulatory Visit | Attending: Neurosurgery | Admitting: Neurosurgery

## 2016-05-30 DIAGNOSIS — M5126 Other intervertebral disc displacement, lumbar region: Secondary | ICD-10-CM

## 2016-05-30 MED ORDER — METHYLPREDNISOLONE ACETATE 40 MG/ML INJ SUSP (RADIOLOG
120.0000 mg | Freq: Once | INTRAMUSCULAR | Status: AC
  Administered 2016-05-30: 120 mg via EPIDURAL

## 2016-05-30 MED ORDER — IOPAMIDOL (ISOVUE-M 200) INJECTION 41%
1.0000 mL | Freq: Once | INTRAMUSCULAR | Status: AC
  Administered 2016-05-30: 1 mL via EPIDURAL

## 2016-05-30 NOTE — Telephone Encounter (Signed)
No,. I will not,.  I have not seen this patient since 2014. He will need to restablish with Claris CheMargaret since he was seeing Naomie Deanarrie Doss ,  Our former NP ,  Last visit with her March 2016

## 2016-05-30 NOTE — Telephone Encounter (Signed)
Pt called and was asking if Dr. Darrick Huntsmanullo could sent over a referral to [facility] in [place] for a second opinion. Please advise, thank you!  Call [phone]

## 2016-05-30 NOTE — Telephone Encounter (Signed)
Can you please advise patient, he will need a appt with a new Provider as Dr. Darrick Huntsmantullo has not seen since 2014.

## 2016-05-30 NOTE — Telephone Encounter (Signed)
Pt called back, explained that we would not be able to put in a referral until he establishes with a new provider. Pt understood, is going to call and see if his surgeon can put in for a new referral.

## 2016-05-30 NOTE — Discharge Instructions (Signed)

## 2016-05-30 NOTE — Telephone Encounter (Signed)
Called and left msg for patient to call the office.

## 2017-08-25 IMAGING — MR MR LUMBAR SPINE WO/W CM
4 of 7 series · 19 of 48 positions shown · IV contrast (multihance)
Comparison: Preoperative MRI 06/28/2016

CLINICAL DATA: Surgery 8 months ago. Pain in the left leg and foot
persisting.

EXAM:
MRI LUMBAR SPINE WITHOUT AND WITH CONTRAST
TECHNIQUE: Multiplanar and multiecho pulse sequences of the lumbar spine were
obtained without and with intravenous contrast.
CONTRAST:  16mL MULTIHANCE GADOBENATE DIMEGLUMINE 529 MG/ML IV SOLN

[Series 2: T2 · sagittal · 4.0mm · 0.55mm/px · 5 of 17 slices shown (1 of 2)]
[im 1/17]
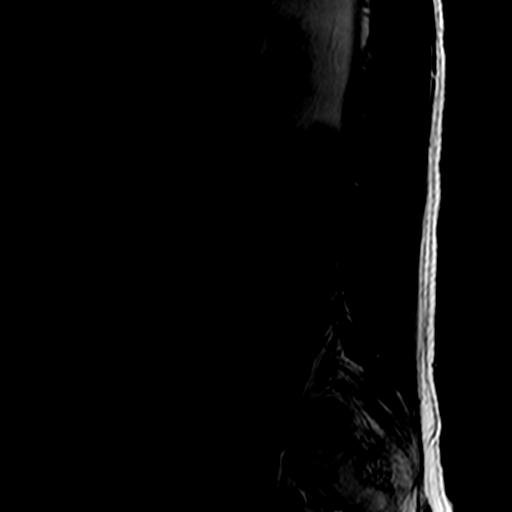
[im 5/17]
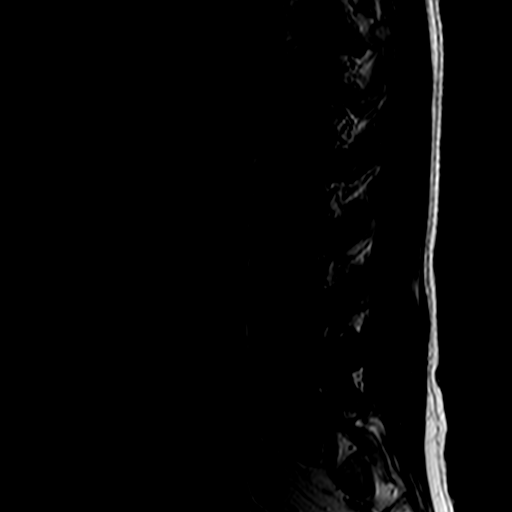
[im 9/17]
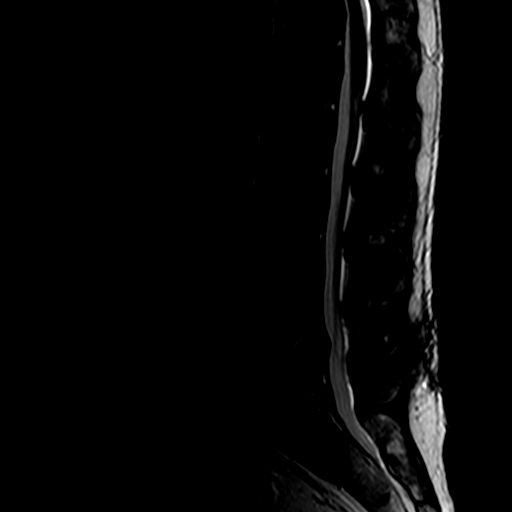
[im 13/17]
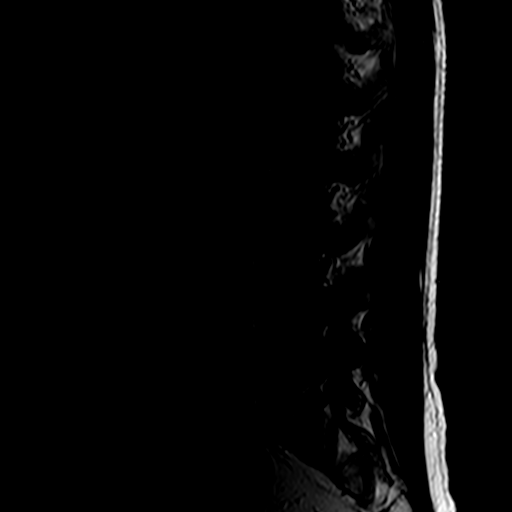
[im 17/17]
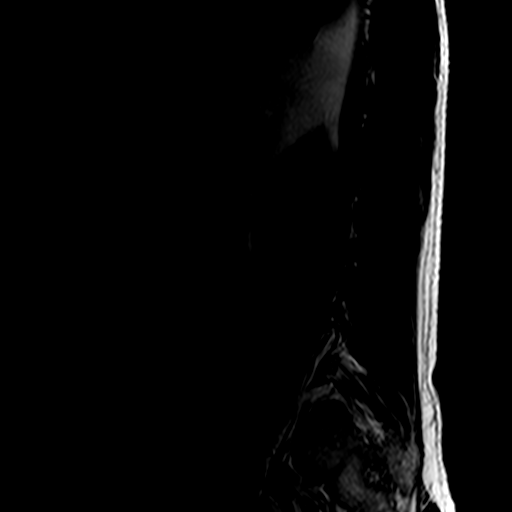

[Series 3: T1 · sagittal · 4.0mm · 0.55mm/px · 3 of 17 slices shown (1 of 2)]
[im 1/17]
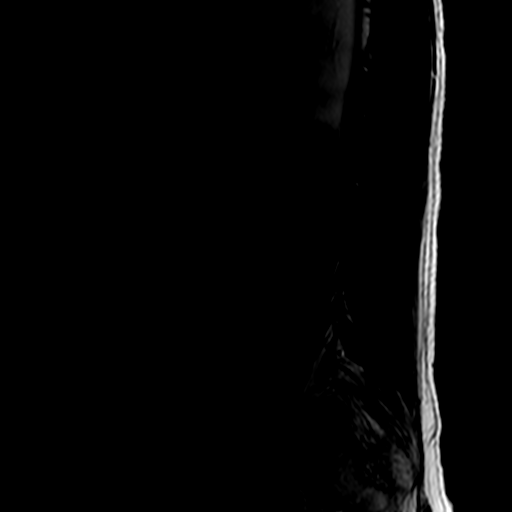
[im 9/17]
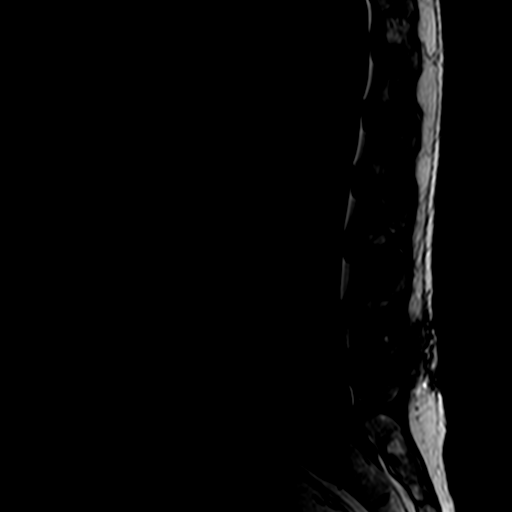
[im 17/17]
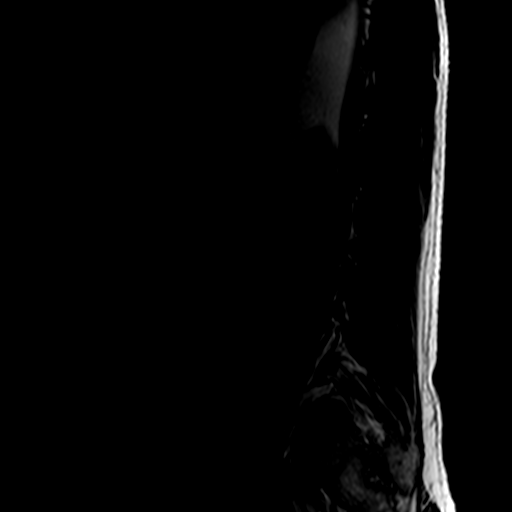

[Series 5: T2 · axial · 4.0mm · 0.39mm/px · z∈[-66,+154]mm · 8 of 40 slices shown (2 of 2)]
[im 1/40]
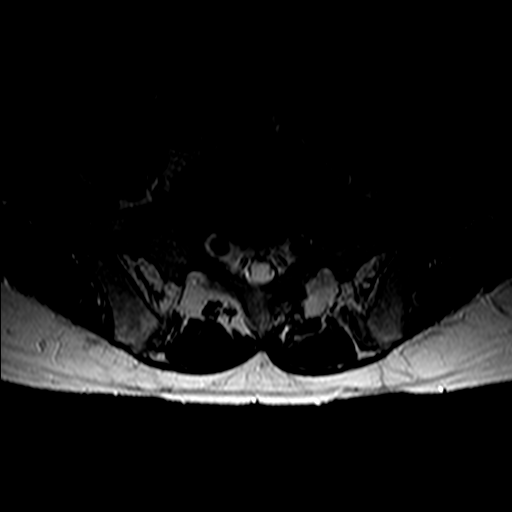
[im 5/40]
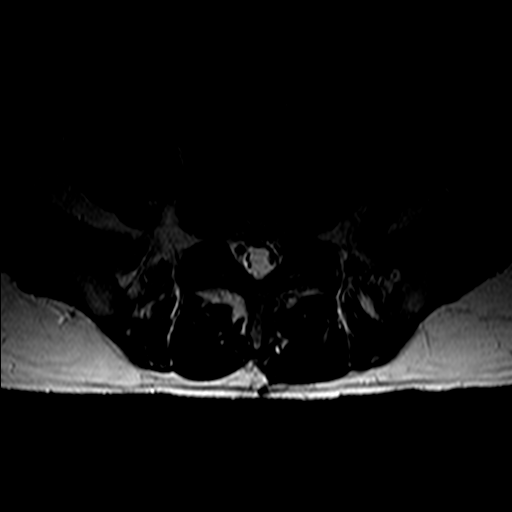
[im 14/40]
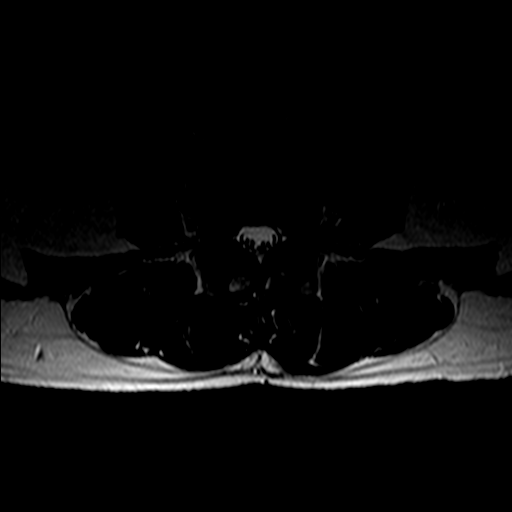
[im 18/40]
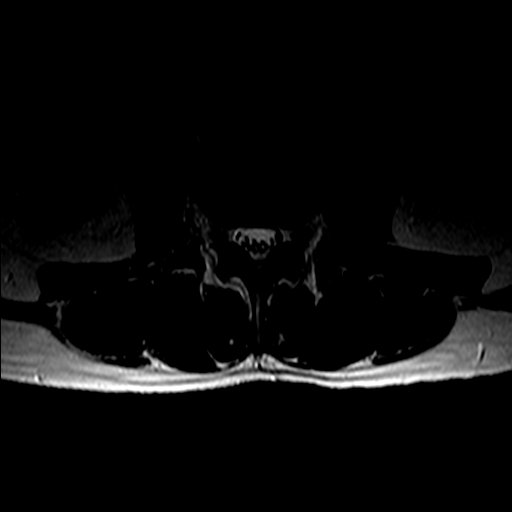
[im 22/40]
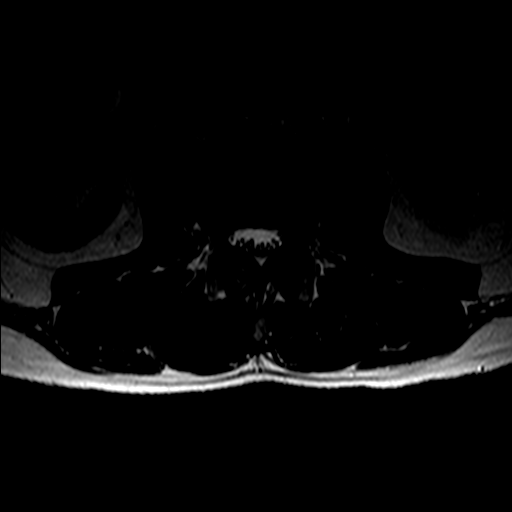
[im 27/40]
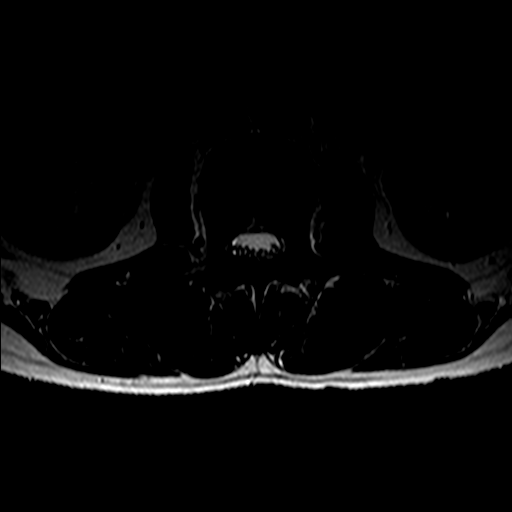
[im 35/40]
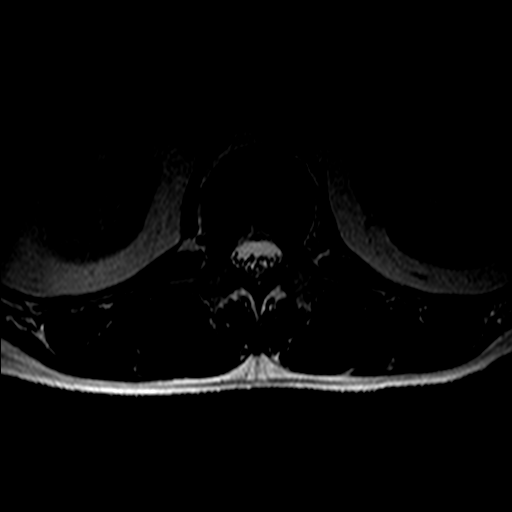
[im 40/40]
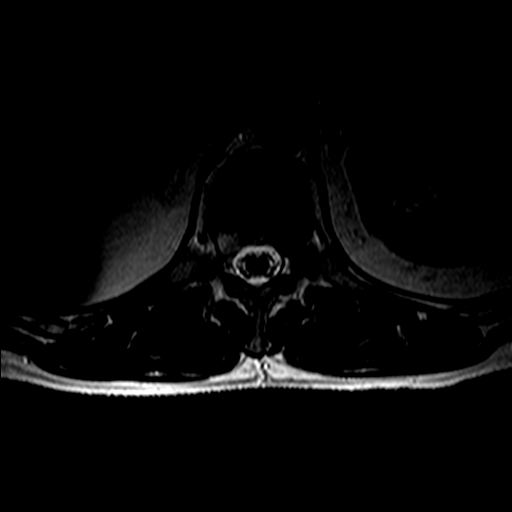

[Series 6: T1 · axial · 4.0mm · 0.39mm/px · z∈[-45,+128]mm · 3 of 40 slices shown (2 of 2)]
[im 5/40]
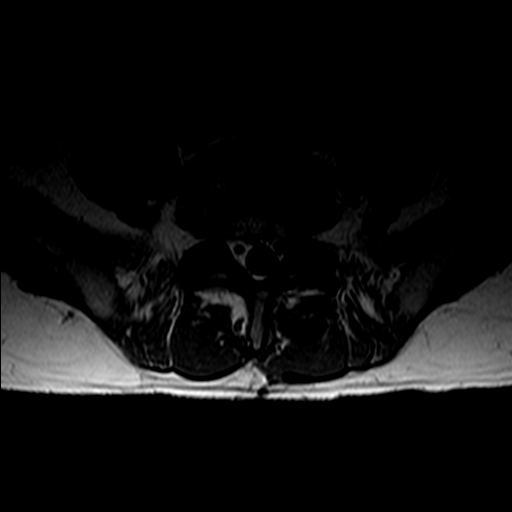
[im 22/40]
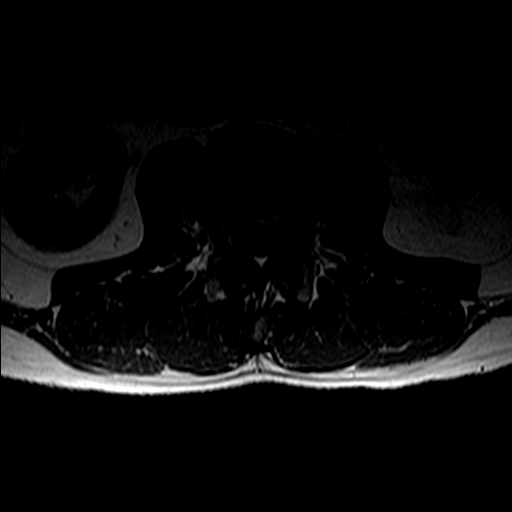
[im 35/40]
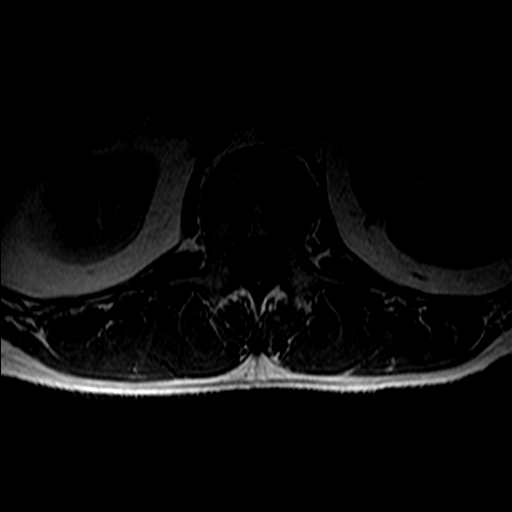

[19 of 48 positions shown; findings below may reference images not displayed]

FINDINGS: Segmentation:  5 lumbar type vertebral bodies.

Alignment: Normal. Some straightening of the normal lumbar lordosis.

Vertebrae:  No fracture or primary bone lesion.

Conus medullaris: Extends to the L1 level and appears normal.

Paraspinal and other soft tissues: No significant finding.

Disc levels:

No significant finding at L3-4 or above. No disc herniation. No
canal or foraminal stenosis. Minimal facet hypertrophy at L3-4.

L4-5: Previous left hemilaminectomy and discectomy. Recurrent
central to left-sided disc herniation. Bilateral facet hypertrophy.
Narrowing of both lateral recesses left more than right. Ongoing
neural compression could be present.

L5-S1: Chronic disc degeneration with endplate osteophytes and
bulging of the disc. Mild facet hypertrophy. Mild stenosis of the
lateral recesses and foramina left more than right without definite
neural compression. No apparent change since the previous study.
IMPRESSION: Previous left hemilaminectomy and discectomy at L4-5. Recurrent
broad-based left posterior lateral prominent disc herniation with
narrowing of the lateral recesses left more than right.

## 2018-09-14 ENCOUNTER — Ambulatory Visit: Payer: PRIVATE HEALTH INSURANCE | Admitting: Family Medicine

## 2018-09-14 ENCOUNTER — Encounter: Payer: Self-pay | Admitting: Family Medicine

## 2018-09-14 ENCOUNTER — Ambulatory Visit (INDEPENDENT_AMBULATORY_CARE_PROVIDER_SITE_OTHER): Payer: PRIVATE HEALTH INSURANCE

## 2018-09-14 ENCOUNTER — Ambulatory Visit: Payer: Self-pay | Admitting: Internal Medicine

## 2018-09-14 VITALS — BP 110/84 | HR 77 | Temp 98.2°F | Resp 18 | Ht 70.0 in | Wt 175.2 lb

## 2018-09-14 DIAGNOSIS — M79672 Pain in left foot: Secondary | ICD-10-CM

## 2018-09-14 DIAGNOSIS — Z0000 Encounter for general adult medical examination without abnormal findings: Secondary | ICD-10-CM

## 2018-09-14 NOTE — Progress Notes (Signed)
Subjective:    Patient ID: Colton Morris, male    DOB: 1975/11/06, 43 y.o.   MRN: [id number]  HPI   Patient presents to clinic to establish with PCP, had previously seen Dr. Darrick Huntsmanullo in the past however has not seen her since 2014.  Currently he has no medical issues.  He is taking no chronic medications.  Past medical, surgical, family social history reviewed and updated accordingly in chart.  Patient's main concern today is to get fasting lab work ordered, have his blood pressure checked due to finding out that both his parents and older brother have high blood pressure, and to have his foot looked at.  Patient denies any history of cancer in the family.  He sees the eye doctor every 2 years, sees dentist twice per year.  Tries to follow a healthy and balanced diet and get regular physical activity.  He is married and has a 42-year-old daughter.  Patient states he has struggled with the sensation of a foreign body feeling in the left ball of his foot off and on for years.  Patient states that his dog's hair is very coarse and often he will be pulling coarse dog hairs out of his skin.  Wonders if a dog hair has gone up into foot.   Patient Active Problem List   Diagnosis Date Noted  . Left lumbar radiculopathy 09/30/2014  . Bursitis of elbow 05/02/2013  . Foreign body in foot, left 05/02/2013  . Dyspepsia 03/07/2012  . Decreased libido without sexual dysfunction 03/07/2012  . Insomnia 03/07/2012   Social History   Tobacco Use  . Smoking status: Never Smoker  . Smokeless tobacco: Never Used  Substance Use Topics  . Alcohol use: Yes    Alcohol/week: 6.0 standard drinks    Types: 6 Standard drinks or equivalent per week    Comment: weekly   Past Surgical History:  Procedure Laterality Date  . BACK SURGERY  2017    Family History  Problem Relation Age of Onset  . Cancer Neg Hx     Review of Systems  Constitutional: Negative for chills, fatigue and fever.    HENT: Negative for congestion, ear pain, sinus pain and sore throat.   Eyes: Negative.   Respiratory: Negative for cough, shortness of breath and wheezing.   Cardiovascular: Negative for chest pain, palpitations and leg swelling.  Gastrointestinal: Negative for abdominal pain, diarrhea, nausea and vomiting.  Genitourinary: Negative for dysuria, frequency and urgency.  Musculoskeletal: Sensation of foreign body in left foot.   Skin: Negative for color change, pallor and rash.  Neurological: Negative for syncope, light-headedness and headaches.  Psychiatric/Behavioral: The patient is not nervous/anxious.       Objective:   Physical Exam Vitals signs and nursing note reviewed.  Constitutional:      General: He is not in acute distress.    Appearance: He is not toxic-appearing.  HENT:     Head: Normocephalic and atraumatic.     Right Ear: Tympanic membrane, ear canal and external ear normal.     Left Ear: Tympanic membrane, ear canal and external ear normal.     Nose: Nose normal.     Mouth/Throat:     Mouth: Mucous membranes are moist.  Eyes:     General: No scleral icterus.    Extraocular Movements: Extraocular movements intact.     Conjunctiva/sclera: Conjunctivae normal.     Pupils: Pupils are equal, round, and reactive to light.  Neck:  Musculoskeletal: Normal range of motion and neck supple. No neck rigidity.  Cardiovascular:     Rate and Rhythm: Normal rate and regular rhythm.     Pulses:          Dorsalis pedis pulses are 2+ on the right side and 2+ on the left side.       Posterior tibial pulses are 2+ on the right side and 2+ on the left side.     Heart sounds: Normal heart sounds.  Pulmonary:     Effort: Pulmonary effort is normal. No respiratory distress.     Breath sounds: Normal breath sounds.  Abdominal:     General: Abdomen is flat. Bowel sounds are normal. There is no distension.     Palpations: Abdomen is soft. There is no mass.     Tenderness: There is  no abdominal tenderness. There is no guarding or rebound.  Genitourinary:    Comments: Declines GU exam in clinic today Musculoskeletal:        General: Tenderness (bottom of left foot) present. No deformity.     Right lower leg: No edema.     Left lower leg: No edema.  Feet:     Right foot:     Skin integrity: Skin integrity normal.     Left foot:     Skin integrity: Callus (small callus ball of left foot) present.  Lymphadenopathy:     Cervical: No cervical adenopathy.  Skin:    General: Skin is warm and dry.     Capillary Refill: Capillary refill takes less than 2 seconds.     Coloration: Skin is not jaundiced or pale.  Neurological:     Mental Status: He is alert and oriented to person, place, and time.     Motor: No weakness.     Gait: Gait normal.  Psychiatric:        Mood and Affect: Mood normal.        Behavior: Behavior normal.     Vitals:   09/14/18 1605  BP: 110/84  Pulse: 77  Resp: 18  Temp: 98.2 F (36.8 C)  SpO2: 98%      Assessment & Plan:   Well adult exam - we will order fasting blood work today, patient will schedule lab work at a later time so he can come fasting.  Discussed regular physical activity and balanced diet.  Recommended a diet higher in vegetables and lean proteins, lower in carbs and processed sugars.  Also discussed sun safety, recommended wearing sunscreen of at least SPF 30 when outdoors for extended period of time.  Patient always wears seatbelt when he is in a vehicle.  Foreign body sensation in left foot - we will get foot x-ray in clinic today.  We will also do referral to podiatry.   Patient will return to clinic annually for complete physical exam.  He is advised he can return to clinic sooner if any issues arise.  Someone will contact him in regards to his podiatry referral and his lab results when they are available.

## 2018-10-01 ENCOUNTER — Other Ambulatory Visit (INDEPENDENT_AMBULATORY_CARE_PROVIDER_SITE_OTHER): Payer: PRIVATE HEALTH INSURANCE

## 2018-10-01 DIAGNOSIS — Z0000 Encounter for general adult medical examination without abnormal findings: Secondary | ICD-10-CM | POA: Diagnosis not present

## 2018-10-01 LAB — B12 AND FOLATE PANEL
Folate: 17.7 ng/mL (ref >5.9)
Vitamin B-12: 329 pg/mL (ref 211–911)

## 2018-10-01 LAB — COMPREHENSIVE METABOLIC PANEL
ALT: 39 U/L (ref 0–53)
AST: 25 U/L (ref 0–37)
Albumin: 4.4 g/dL (ref 3.5–5.2)
Alkaline Phosphatase: 61 U/L (ref 39–117)
BUN: 15 mg/dL (ref 6–23)
CO2: 30 meq/L (ref 19–32)
Calcium: 9.3 mg/dL (ref 8.4–10.5)
Chloride: 103 mEq/L (ref 96–112)
Creatinine, Ser: 0.9 mg/dL (ref 0.40–1.50)
GFR: 92.93 mL/min (ref >60.00)
GLUCOSE: 100 mg/dL — AB (ref 70–99)
POTASSIUM: 4.7 meq/L (ref 3.5–5.1)
SODIUM: 141 meq/L (ref 135–145)
Total Bilirubin: 1 mg/dL (ref 0.2–1.2)
Total Protein: 7.1 g/dL (ref 6.0–8.3)

## 2018-10-01 LAB — LIPID PANEL
Cholesterol: 101 mg/dL (ref 0–200)
HDL: 52.6 mg/dL (ref >39.00)
LDL Cholesterol: 38 mg/dL (ref 0–99)
NONHDL: 47.95
Total CHOL/HDL Ratio: 2
Triglycerides: 51 mg/dL (ref 0.0–149.0)
VLDL: 10.2 mg/dL (ref 0.0–40.0)

## 2018-10-01 LAB — CBC
HEMATOCRIT: 45.9 % (ref 39.0–52.0)
HEMOGLOBIN: 15.5 g/dL (ref 13.0–17.0)
MCHC: 33.8 g/dL (ref 30.0–36.0)
MCV: 91.7 fl (ref 78.0–100.0)
Platelets: 263 10*3/uL (ref 150.0–400.0)
RBC: 5.01 Mil/uL (ref 4.22–5.81)
RDW: 13.6 % (ref 11.5–15.5)
WBC: 4.4 10*3/uL (ref 4.0–10.5)

## 2018-10-01 LAB — VITAMIN D 25 HYDROXY (VIT D DEFICIENCY, FRACTURES): VITD: 44.89 ng/mL (ref 30.00–100.00)

## 2018-10-02 LAB — THYROID PANEL WITH TSH
FREE THYROXINE INDEX: 2.8 (ref 1.4–3.8)
T3 UPTAKE: 32 % (ref 22–35)
T4, Total: 8.7 ug/dL (ref 4.9–10.5)
TSH: 1.3 mIU/L (ref 0.40–4.50)

## 2018-11-30 ENCOUNTER — Telehealth: Payer: PRIVATE HEALTH INSURANCE | Admitting: Physician Assistant

## 2018-11-30 DIAGNOSIS — H938X1 Other specified disorders of right ear: Secondary | ICD-10-CM

## 2018-11-30 NOTE — Progress Notes (Signed)
Based on what you shared with me, I feel your condition warrants further evaluation and I recommend that you be seen for a face to face office visit.     NOTE: If you entered your credit card information for this eVisit, you will not be charged. You may see a "hold" on your card for the $35 but that hold will drop off and you will not have a charge processed.  If you are having a true medical emergency please call 911.  If you need an urgent face to face visit, [facility] has four urgent care centers for your convenience.    PLEASE NOTE: THE [facility] AND URGENT CARE CLINICS DO NOT HAVE THE TESTING FOR CORONAVIRUS COVID19 AVAILABLE.  IF YOU FEEL YOU NEED THIS TEST YOU MUST GO TO A TRIAGE LOCATION AT ONE OF THE HOSPITAL EMERGENCY DEPARTMENTS   [email or web address] to reserve your spot online an avoid wait times  [facility] [address], [address] [place], [place] [address] Modified hours of operation: Monday-Friday, 10 AM to 6 PM  Saturday & Sunday 10 AM to 4 PM *Across the street from [employer]  [facility] (New Address!) [address], [address] [place], [place] [address] *Just off [address], across the road from [employer]* Modified hours of operation: Monday-Friday, 10 AM to 5 PM  Closed Saturday & Sunday   The following sites will take your insurance:  . [facility]  [phone] Get Driving Directions Find a Provider at this Location  [address] [place], [place] [address] . 10 am to 8 pm Monday-Friday . 12 pm to 8 pm Saturday-Sunday   . [facility]  [phone] Get Driving Directions Find a Provider at this Location  [address], [address] , [place] [address] . 8 am to 8 pm Monday-Friday . 9 am to 6 pm Saturday . 11 am to 6 pm Sunday   . [facility] [place]  [phone] Get Driving Directions  [address] [place], [place] [address] . 8 am to 8 pm Monday-Friday . 8 am to 4 pm Saturday-Sunday   Your e-visit answers were reviewed by a board certified advanced clinical practitioner to complete your personal care plan.  Thank you for using e-Visits. 

## 2019-04-27 ENCOUNTER — Telehealth: Payer: Self-pay

## 2019-04-27 NOTE — Telephone Encounter (Signed)
I will forward to vanessa, not sure what to do with billing questions

## 2019-04-27 NOTE — Telephone Encounter (Signed)
Copied from [place] [id number]. Topic: Complaint - Billing/Coding >> Apr 27, 2019  3:53 PM Virl Axe D wrote: DOS: 10/01/18 Details of complaint: Pt stated he received a bill from [facility] for labs. Pt spoke with insurance company and they told him nothing was ever submitted to them because they would have covered claim. How would the patient like to see this issue resolved? Requesting claim to be resubmitted to insurance and callback.   Will automatically be routed to [facility] pool.

## 2019-04-27 NOTE — Telephone Encounter (Signed)
Copied from CRM #[id number]. Topic: Complaint - Billing/Coding >> Apr 27, 2019  3:53 PM Hudson, Caryn D wrote: DOS: 10/01/18 Details of complaint: Pt stated he received a bill from [facility] for labs. Pt spoke with insurance company and they told him nothing was ever submitted to them because they would have covered claim. How would the patient like to see this issue resolved? Requesting claim to be resubmitted to insurance and callback.   Will automatically be routed to [facility] Coding pool. 

## 2019-04-27 NOTE — Telephone Encounter (Signed)
DOS: 10/01/18 Details of complaint: Pt stated he received a bill from [facility] for labs. Pt spoke with insurance company and they told him nothing was ever submitted to them because they would have covered claim. How would the patient like to see this issue resolved? Requesting claim to be resubmitted to insurance and callback.

## 2019-04-28 NOTE — Telephone Encounter (Signed)
I sent the patient a my chart message to inform him that his insurance information has been updated with [facility] and they will resubmit his claim. Also per the representative to ignore the bill he received.

## 2019-07-30 ENCOUNTER — Telehealth: Payer: Self-pay | Admitting: Family Medicine

## 2019-07-30 NOTE — Telephone Encounter (Signed)
This will be ok   [place]

## 2019-07-30 NOTE — Telephone Encounter (Signed)
Patient is calling to ask Dr. Olivia Mackie If she would consider taking him on as a patient. Patient wife, LYON DUMONT is a patient of Dr. Olivia Mackie. Please advise. Patient would like a response back via MyChart. Please advise Thank you

## 2019-08-04 NOTE — Telephone Encounter (Signed)
I called pt and he is now scheduled for NP appt.

## 2019-09-16 ENCOUNTER — Encounter: Payer: Self-pay | Admitting: Family Medicine

## 2019-10-08 ENCOUNTER — Encounter: Payer: PRIVATE HEALTH INSURANCE | Admitting: Internal Medicine

## 2019-10-11 ENCOUNTER — Other Ambulatory Visit: Payer: Self-pay

## 2019-10-12 ENCOUNTER — Other Ambulatory Visit: Payer: Self-pay

## 2019-10-12 ENCOUNTER — Encounter: Payer: Self-pay | Admitting: Internal Medicine

## 2019-10-12 ENCOUNTER — Ambulatory Visit (INDEPENDENT_AMBULATORY_CARE_PROVIDER_SITE_OTHER): Payer: PRIVATE HEALTH INSURANCE | Admitting: Internal Medicine

## 2019-10-12 VITALS — BP 116/84 | HR 95 | Temp 97.7°F | Ht 70.0 in | Wt 177.8 lb

## 2019-10-12 DIAGNOSIS — Z0000 Encounter for general adult medical examination without abnormal findings: Secondary | ICD-10-CM

## 2019-10-12 DIAGNOSIS — Z1389 Encounter for screening for other disorder: Secondary | ICD-10-CM

## 2019-10-12 DIAGNOSIS — L918 Other hypertrophic disorders of the skin: Secondary | ICD-10-CM

## 2019-10-12 DIAGNOSIS — H11153 Pinguecula, bilateral: Secondary | ICD-10-CM

## 2019-10-12 DIAGNOSIS — Z1329 Encounter for screening for other suspected endocrine disorder: Secondary | ICD-10-CM | POA: Diagnosis not present

## 2019-10-12 DIAGNOSIS — Z13220 Encounter for screening for lipoid disorders: Secondary | ICD-10-CM | POA: Diagnosis not present

## 2019-10-12 DIAGNOSIS — H11003 Unspecified pterygium of eye, bilateral: Secondary | ICD-10-CM

## 2019-10-12 NOTE — Patient Instructions (Addendum)
<130/<80 goal monitor  In your eye ptyergium and/or  [place]>refer [facility]    COVID-19 Vaccine Information can be found at: [email or web address].nl For questions related to vaccine distribution or appointments, please email [email or web address] or call [phone].    Low-Sodium Eating Plan Sodium, which is an element that makes up salt, helps you maintain a healthy balance of fluids in your body. Too much sodium can increase your blood pressure and cause fluid and waste to be held in your body. Your health care provider or dietitian may recommend following this plan if you have high blood pressure (hypertension), kidney disease, liver disease, or heart failure. Eating less sodium can help lower your blood pressure, reduce swelling, and protect your heart, liver, and kidneys. What are tips for following this plan? General guidelines  Most people on this plan should limit their sodium intake to 1,500-2,000 mg (milligrams) of sodium each day. Reading food labels   The Nutrition Facts label lists the amount of sodium in one serving of the food. If you eat more than one serving, you must multiply the listed amount of sodium by the number of servings.  Choose foods with less than 140 mg of sodium per serving.  Avoid foods with 300 mg of sodium or more per serving. Shopping  Look for lower-sodium products, often labeled as "low-sodium" or "no salt added."  Always check the sodium content even if foods are labeled as "unsalted" or "no salt added".  Buy fresh foods. ? Avoid canned foods and premade or frozen meals. ? Avoid canned, cured, or processed meats  Buy breads that have less than 80 mg of sodium per slice. Cooking  Eat more home-cooked food and less restaurant, buffet, and fast food.  Avoid adding salt when cooking. Use salt-free seasonings or herbs instead of table salt or sea salt. Check with your health care  provider or pharmacist before using salt substitutes.  Cook with plant-based oils, such as canola, sunflower, or olive oil. Meal planning  When eating at a restaurant, ask that your food be prepared with less salt or no salt, if possible.  Avoid foods that contain MSG (monosodium glutamate). MSG is sometimes added to [place] food, bouillon, and some canned foods. What foods are recommended? The items listed may not be a complete list. Talk with your dietitian about what dietary choices are best for you. Grains Low-sodium cereals, including oats, puffed wheat and rice, and shredded wheat. Low-sodium crackers. Unsalted rice. Unsalted pasta. Low-sodium bread. Whole-grain breads and whole-grain pasta. Vegetables Fresh or frozen vegetables. "No salt added" canned vegetables. "No salt added" tomato sauce and paste. Low-sodium or reduced-sodium tomato and vegetable juice. Fruits Fresh, frozen, or canned fruit. Fruit juice. Meats and other protein foods Fresh or frozen (no salt added) meat, poultry, seafood, and fish. Low-sodium canned tuna and salmon. Unsalted nuts. Dried peas, beans, and lentils without added salt. Unsalted canned beans. Eggs. Unsalted nut butters. Dairy Milk. Soy milk. Cheese that is naturally low in sodium, such as ricotta cheese, fresh mozzarella, or Swiss cheese Low-sodium or reduced-sodium cheese. Cream cheese. Yogurt. Fats and oils Unsalted butter. Unsalted margarine with no trans fat. Vegetable oils such as canola or olive oils. Seasonings and other foods Fresh and dried herbs and spices. Salt-free seasonings. Low-sodium mustard and ketchup. Sodium-free salad dressing. Sodium-free light mayonnaise. Fresh or refrigerated horseradish. Lemon juice. Vinegar. Homemade, reduced-sodium, or low-sodium soups. Unsalted popcorn and pretzels. Low-salt or salt-free chips. What foods are not recommended? The items listed may not be a  complete list. Talk with your dietitian about what  dietary choices are best for you. Grains Instant hot cereals. Bread stuffing, pancake, and biscuit mixes. Croutons. Seasoned rice or pasta mixes. Noodle soup cups. Boxed or frozen macaroni and cheese. Regular salted crackers. Self-rising flour. Vegetables Sauerkraut, pickled vegetables, and relishes. Olives. [place] fries. Onion rings. Regular canned vegetables (not low-sodium or reduced-sodium). Regular canned tomato sauce and paste (not low-sodium or reduced-sodium). Regular tomato and vegetable juice (not low-sodium or reduced-sodium). Frozen vegetables in sauces. Meats and other protein foods Meat or fish that is salted, canned, smoked, spiced, or pickled. Bacon, ham, sausage, hotdogs, corned beef, chipped beef, packaged lunch meats, salt pork, jerky, pickled herring, anchovies, regular canned tuna, sardines, salted nuts. Dairy Processed cheese and cheese spreads. Cheese curds. Blue cheese. Feta cheese. String cheese. Regular cottage cheese. Buttermilk. Canned milk. Fats and oils Salted butter. Regular margarine. Ghee. Bacon fat. Seasonings and other foods Onion salt, garlic salt, seasoned salt, table salt, and sea salt. Canned and packaged gravies. Worcestershire sauce. Tartar sauce. Barbecue sauce. Teriyaki sauce. Soy sauce, including reduced-sodium. Steak sauce. Fish sauce. Oyster sauce. Cocktail sauce. Horseradish that you find on the shelf. Regular ketchup and mustard. Meat flavorings and tenderizers. Bouillon cubes. Hot sauce and Tabasco sauce. Premade or packaged marinades. Premade or packaged taco seasonings. Relishes. Regular salad dressings. Salsa. Potato and tortilla chips. Corn chips and puffs. Salted popcorn and pretzels. Canned or dried soups. Pizza. Frozen entrees and pot pies. Summary  Eating less sodium can help lower your blood pressure, reduce swelling, and protect your heart, liver, and kidneys.  Most people on this plan should limit their sodium intake to 1,500-2,000 mg  (milligrams) of sodium each day.  Canned, boxed, and frozen foods are high in sodium. Restaurant foods, fast foods, and pizza are also very high in sodium. You also get sodium by adding salt to food.  Try to cook at home, eat more fresh fruits and vegetables, and eat less fast food, canned, processed, or prepared foods. This information is not intended to replace advice given to you by your health care provider. Make sure you discuss any questions you have with your health care provider. Document Revised: 07/18/2017 Document Reviewed: 07/29/2016 Elsevier Patient Education  2020 Elsevier Inc.  DASH Eating Plan DASH stands for "Dietary Approaches to Stop Hypertension." The DASH eating plan is a healthy eating plan that has been shown to reduce high blood pressure (hypertension). It may also reduce your risk for type 2 diabetes, heart disease, and stroke. The DASH eating plan may also help with weight loss. What are tips for following this plan?  General guidelines  Avoid eating more than 2,300 mg (milligrams) of salt (sodium) a day. If you have hypertension, you may need to reduce your sodium intake to 1,500 mg a day.  Limit alcohol intake to no more than 1 drink a day for nonpregnant women and 2 drinks a day for men. One drink equals 12 oz of beer, 5 oz of wine, or 1 oz of hard liquor.  Work with your health care provider to maintain a healthy body weight or to lose weight. Ask what an ideal weight is for you.  Get at least 30 minutes of exercise that causes your heart to beat faster (aerobic exercise) most days of the week. Activities may include walking, swimming, or biking.  Work with your health care provider or diet and nutrition specialist (dietitian) to adjust your eating plan to your individual calorie needs. Reading food  labels   Check food labels for the amount of sodium per serving. Choose foods with less than 5 percent of the Daily Value of sodium. Generally, foods with less  than 300 mg of sodium per serving fit into this eating plan.  To find whole grains, look for the word "whole" as the first word in the ingredient list. Shopping  Buy products labeled as "low-sodium" or "no salt added."  Buy fresh foods. Avoid canned foods and premade or frozen meals. Cooking  Avoid adding salt when cooking. Use salt-free seasonings or herbs instead of table salt or sea salt. Check with your health care provider or pharmacist before using salt substitutes.  Do not fry foods. Cook foods using healthy methods such as baking, boiling, grilling, and broiling instead.  Cook with heart-healthy oils, such as olive, canola, soybean, or sunflower oil. Meal planning  Eat a balanced diet that includes: ? 5 or more servings of fruits and vegetables each day. At each meal, try to fill half of your plate with fruits and vegetables. ? Up to 6-8 servings of whole grains each day. ? Less than 6 oz of lean meat, poultry, or fish each day. A 3-oz serving of meat is about the same size as a deck of cards. One egg equals 1 oz. ? 2 servings of low-fat dairy each day. ? A serving of nuts, seeds, or beans 5 times each week. ? Heart-healthy fats. Healthy fats called Omega-3 fatty acids are found in foods such as flaxseeds and coldwater fish, like sardines, salmon, and mackerel.  Limit how much you eat of the following: ? Canned or prepackaged foods. ? Food that is high in trans fat, such as fried foods. ? Food that is high in saturated fat, such as fatty meat. ? Sweets, desserts, sugary drinks, and other foods with added sugar. ? Full-fat dairy products.  Do not salt foods before eating.  Try to eat at least 2 vegetarian meals each week.  Eat more home-cooked food and less restaurant, buffet, and fast food.  When eating at a restaurant, ask that your food be prepared with less salt or no salt, if possible. What foods are recommended? The items listed may not be a complete list. Talk  with your dietitian about what dietary choices are best for you. Grains Whole-grain or whole-wheat bread. Whole-grain or whole-wheat pasta. Brown rice. Orpah Cobb. Bulgur. Whole-grain and low-sodium cereals. Pita bread. Low-fat, low-sodium crackers. Whole-wheat flour tortillas. Vegetables Fresh or frozen vegetables (raw, steamed, roasted, or grilled). Low-sodium or reduced-sodium tomato and vegetable juice. Low-sodium or reduced-sodium tomato sauce and tomato paste. Low-sodium or reduced-sodium canned vegetables. Fruits All fresh, dried, or frozen fruit. Canned fruit in natural juice (without added sugar). Meat and other protein foods Skinless chicken or [place]. Ground chicken or [place]. Pork with fat trimmed off. Fish and seafood. Egg whites. Dried beans, peas, or lentils. Unsalted nuts, nut butters, and seeds. Unsalted canned beans. Lean cuts of beef with fat trimmed off. Low-sodium, lean deli meat. Dairy Low-fat (1%) or fat-free (skim) milk. Fat-free, low-fat, or reduced-fat cheeses. Nonfat, low-sodium ricotta or cottage cheese. Low-fat or nonfat yogurt. Low-fat, low-sodium cheese. Fats and oils Soft margarine without trans fats. Vegetable oil. Low-fat, reduced-fat, or light mayonnaise and salad dressings (reduced-sodium). Canola, safflower, olive, soybean, and sunflower oils. Avocado. Seasoning and other foods Herbs. Spices. Seasoning mixes without salt. Unsalted popcorn and pretzels. Fat-free sweets. What foods are not recommended? The items listed may not be a complete list. Talk with  your dietitian about what dietary choices are best for you. Grains Baked goods made with fat, such as croissants, muffins, or some breads. Dry pasta or rice meal packs. Vegetables Creamed or fried vegetables. Vegetables in a cheese sauce. Regular canned vegetables (not low-sodium or reduced-sodium). Regular canned tomato sauce and paste (not low-sodium or reduced-sodium). Regular tomato and vegetable  juice (not low-sodium or reduced-sodium). Angie Fava. Olives. Fruits Canned fruit in a light or heavy syrup. Fried fruit. Fruit in cream or butter sauce. Meat and other protein foods Fatty cuts of meat. Ribs. Fried meat. Berniece Salines. Sausage. Bologna and other processed lunch meats. Salami. Fatback. Hotdogs. Bratwurst. Salted nuts and seeds. Canned beans with added salt. Canned or smoked fish. Whole eggs or egg yolks. Chicken or [place] with skin. Dairy Whole or 2% milk, cream, and half-and-half. Whole or full-fat cream cheese. Whole-fat or sweetened yogurt. Full-fat cheese. Nondairy creamers. Whipped toppings. Processed cheese and cheese spreads. Fats and oils Butter. Stick margarine. Lard. Shortening. Ghee. Bacon fat. Tropical oils, such as coconut, palm kernel, or palm oil. Seasoning and other foods Salted popcorn and pretzels. Onion salt, garlic salt, seasoned salt, table salt, and sea salt. Worcestershire sauce. Tartar sauce. Barbecue sauce. Teriyaki sauce. Soy sauce, including reduced-sodium. Steak sauce. Canned and packaged gravies. Fish sauce. Oyster sauce. Cocktail sauce. Horseradish that you find on the shelf. Ketchup. Mustard. Meat flavorings and tenderizers. Bouillon cubes. Hot sauce and Tabasco sauce. Premade or packaged marinades. Premade or packaged taco seasonings. Relishes. Regular salad dressings. Where to find more information:  National Heart, Lung, and [place]: https://wilson-eaton.com/  American Heart Association: www.heart.org Summary  The DASH eating plan is a healthy eating plan that has been shown to reduce high blood pressure (hypertension). It may also reduce your risk for type 2 diabetes, heart disease, and stroke.  With the DASH eating plan, you should limit salt (sodium) intake to 2,300 mg a day. If you have hypertension, you may need to reduce your sodium intake to 1,500 mg a day.  When on the DASH eating plan, aim to eat more fresh fruits and vegetables, whole grains,  lean proteins, low-fat dairy, and heart-healthy fats.  Work with your health care provider or diet and nutrition specialist (dietitian) to adjust your eating plan to your individual calorie needs. This information is not intended to replace advice given to you by your health care provider. Make sure you discuss any questions you have with your health care provider. Document Revised: 07/18/2017 Document Reviewed: 07/29/2016 Elsevier Patient Education  2020 [employer].

## 2019-10-12 NOTE — Progress Notes (Signed)
Chief Complaint  Patient presents with  . Annual Exam   Annual doing well  1.c/o left under eye tag and also has changes in sclera of b/l eye    Review of Systems  Constitutional: Negative for weight loss.  HENT: Negative for hearing loss.   Eyes: Negative for blurred vision.  Respiratory: Negative for shortness of breath.   Cardiovascular: Negative for chest pain.  Gastrointestinal: Negative for abdominal pain and blood in stool.  Genitourinary:       No issues urinating    Musculoskeletal: Negative for falls.  Skin: Negative for rash.  Psychiatric/Behavioral: Negative for depression and memory loss.   Past Medical History:  Diagnosis Date  . Disc degeneration, lumbar   . History of chicken pox    Past Surgical History:  Procedure Laterality Date  . BACK SURGERY  2017   2016, 2017 ruptured disc x 2   . SKIN BIOPSY     x 2 normal ~2016 chest, near right axilla normal    Family History  Problem Relation Age of Onset  . Hypertension Father   . Heart attack Maternal Grandfather   . Stroke Paternal Grandfather   . Heart attack Paternal Grandfather   . Heart disease Paternal Grandfather   . Hypertension Brother   . Cancer Neg Hx    Social History   Socioeconomic History  . Marital status: Married    Spouse name: Not on file  . Number of children: Not on file  . Years of education: 45  . Highest education level: Not on file  Occupational History  . Occupation: Arts administrator: [employer]  Tobacco Use  . Smoking status: Never Smoker  . Smokeless tobacco: Never Used  Substance and Sexual Activity  . Alcohol use: Yes    Alcohol/week: 6.0 standard drinks    Types: 6 Standard drinks or equivalent per week    Comment: weekly  . Drug use: No  . Sexual activity: Yes  Other Topics Concern  . Not on file  Social History Narrative   Regular exercise-yes   Caffeine Use-yes         Social Determinants of Health   Financial Resource Strain:   .  Difficulty of Paying Living Expenses: Not on file  Food Insecurity:   . Worried About Programme researcher, broadcasting/film/video in the Last Year: Not on file  . Ran Out of Food in the Last Year: Not on file  Transportation Needs:   . Lack of Transportation (Medical): Not on file  . Lack of Transportation (Non-Medical): Not on file  Physical Activity:   . Days of Exercise per Week: Not on file  . Minutes of Exercise per Session: Not on file  Stress:   . Feeling of Stress : Not on file  Social Connections:   . Frequency of Communication with Friends and Family: Not on file  . Frequency of Social Gatherings with Friends and Family: Not on file  . Attends Religious Services: Not on file  . Active Member of Clubs or Organizations: Not on file  . Attends Banker Meetings: Not on file  . Marital Status: Not on file  Intimate Partner Violence:   . Fear of Current or Ex-Partner: Not on file  . Emotionally Abused: Not on file  . Physically Abused: Not on file  . Sexually Abused: Not on file   Current Meds  Medication Sig  . Multiple Vitamin (MULTIVITAMIN) tablet Take 1 tablet by mouth daily.  No Known Allergies No results found for this or any previous visit (from the past 2160 hour(s)). Objective  Body mass index is 25.51 kg/m. Wt Readings from Last 3 Encounters:  10/12/19 177 lb 12.8 oz (80.6 kg)  09/14/18 175 lb 3.2 oz (79.5 kg)  03/10/15 175 lb (79.4 kg)   Temp Readings from Last 3 Encounters:  10/12/19 97.7 F (36.5 C) (Skin)  09/14/18 98.2 F (36.8 C) (Oral)  03/10/15 98.5 F (36.9 C) (Oral)   BP Readings from Last 3 Encounters:  10/12/19 116/84  09/14/18 110/84  05/30/16 126/86   Pulse Readings from Last 3 Encounters:  10/12/19 95  09/14/18 77  05/30/16 78    Physical Exam Vitals and nursing note reviewed.  Constitutional:      Appearance: Normal appearance. He is well-developed and well-groomed.  HENT:     Head: Normocephalic and atraumatic.  Eyes:      Conjunctiva/sclera: Conjunctivae normal.     Pupils: Pupils are equal, round, and reactive to light.  Cardiovascular:     Rate and Rhythm: Normal rate and regular rhythm.     Heart sounds: Normal heart sounds. No murmur.  Pulmonary:     Effort: Pulmonary effort is normal.     Breath sounds: Normal breath sounds.  Abdominal:     General: Abdomen is flat. Bowel sounds are normal.     Tenderness: There is no abdominal tenderness.  Musculoskeletal:     Right lower leg: No edema.     Left lower leg: No edema.  Skin:    General: Skin is warm and dry.     Comments: lentigos  Angiomas   Neurological:     General: No focal deficit present.     Mental Status: He is alert and oriented to person, place, and time. Mental status is at baseline.     Gait: Gait normal.  Psychiatric:        Attention and Perception: Attention and perception normal.        Mood and Affect: Mood and affect normal.        Speech: Speech normal.        Behavior: Behavior normal. Behavior is cooperative.        Thought Content: Thought content normal.        Cognition and Memory: Cognition normal.        Judgment: Judgment normal.     Assessment  Plan  Annual physical exam -  Declines flu shot  tdap 03/03/15  Considering covid 19 vaccine   Fasting labs [facility] Colonoscopy screening age 68-49  Skin 2016 check and bx x 2 normal per pt [facility] Dr. Raliegh Ip rec healthy diet and exercise    Changes in eyes left under eye likely skin tag  Referred [facility]   Provider: Dr. Olivia Mackie McLean-[facility]oc[facility]

## 2019-10-14 LAB — URINALYSIS, ROUTINE W REFLEX MICROSCOPIC
Bilirubin, UA: NEGATIVE
Glucose, UA: NEGATIVE
Leukocytes,UA: NEGATIVE
Nitrite, UA: NEGATIVE
Protein,UA: NEGATIVE
RBC, UA: NEGATIVE
Specific Gravity, UA: 1.026 (ref 1.005–1.030)
Urobilinogen, Ur: 0.2 mg/dL (ref 0.2–1.0)
pH, UA: 5.5 (ref 5.0–7.5)

## 2019-10-14 LAB — COMPREHENSIVE METABOLIC PANEL
ALT: 39 IU/L (ref 0–44)
AST: 21 IU/L (ref 0–40)
Albumin/Globulin Ratio: 2 (ref 1.2–2.2)
Albumin: 4.6 g/dL (ref 4.0–5.0)
Alkaline Phosphatase: 88 IU/L (ref 39–117)
BUN/Creatinine Ratio: 20 (ref 9–20)
BUN: 18 mg/dL (ref 6–24)
Bilirubin Total: 0.5 mg/dL (ref 0.0–1.2)
CO2: 23 mmol/L (ref 20–29)
Calcium: 9.2 mg/dL (ref 8.7–10.2)
Chloride: 102 mmol/L (ref 96–106)
Creatinine, Ser: 0.9 mg/dL (ref 0.76–1.27)
GFR calc Af Amer: 121 mL/min/{1.73_m2} (ref >59)
GFR calc non Af Amer: 105 mL/min/{1.73_m2} (ref >59)
Globulin, Total: 2.3 g/dL (ref 1.5–4.5)
Glucose: 93 mg/dL (ref 65–99)
Potassium: 4.8 mmol/L (ref 3.5–5.2)
Sodium: 139 mmol/L (ref 134–144)
Total Protein: 6.9 g/dL (ref 6.0–8.5)

## 2019-10-14 LAB — CBC WITH DIFFERENTIAL/PLATELET
Basophils Absolute: 0 10*3/uL (ref 0.0–0.2)
Basos: 1 %
EOS (ABSOLUTE): 0.1 10*3/uL (ref 0.0–0.4)
Eos: 2 %
Hematocrit: 44.6 % (ref 37.5–51.0)
Hemoglobin: 15.2 g/dL (ref 13.0–17.7)
Immature Grans (Abs): 0 10*3/uL (ref 0.0–0.1)
Immature Granulocytes: 0 %
Lymphocytes Absolute: 1.7 10*3/uL (ref 0.7–3.1)
Lymphs: 42 %
MCH: 31.3 pg (ref 26.6–33.0)
MCHC: 34.1 g/dL (ref 31.5–35.7)
MCV: 92 fL (ref 79–97)
Monocytes Absolute: 0.3 10*3/uL (ref 0.1–0.9)
Monocytes: 8 %
Neutrophils Absolute: 1.9 10*3/uL (ref 1.4–7.0)
Neutrophils: 47 %
Platelets: 303 10*3/uL (ref 150–450)
RBC: 4.86 x10E6/uL (ref 4.14–5.80)
RDW: 12.9 % (ref 11.6–15.4)
WBC: 4 10*3/uL (ref 3.4–10.8)

## 2019-10-14 LAB — LIPID PANEL
Chol/HDL Ratio: 2 ratio (ref 0.0–5.0)
Cholesterol, Total: 121 mg/dL (ref 100–199)
HDL: 60 mg/dL (ref >39)
LDL Chol Calc (NIH): 46 mg/dL (ref 0–99)
Triglycerides: 72 mg/dL (ref 0–149)
VLDL Cholesterol Cal: 15 mg/dL (ref 5–40)

## 2019-10-14 LAB — TSH: TSH: 1.75 u[IU]/mL (ref 0.450–4.500)

## 2020-01-07 IMAGING — DX DG FOOT COMPLETE 3+V*L*
3 series · 3 of 3 positions shown · non-contrast
Comparison: None.

CLINICAL DATA: Pain on the plantar surface of the left foot.
Question foreign body.

EXAM:
LEFT FOOT - COMPLETE 3+ VIEW

[foot ap]
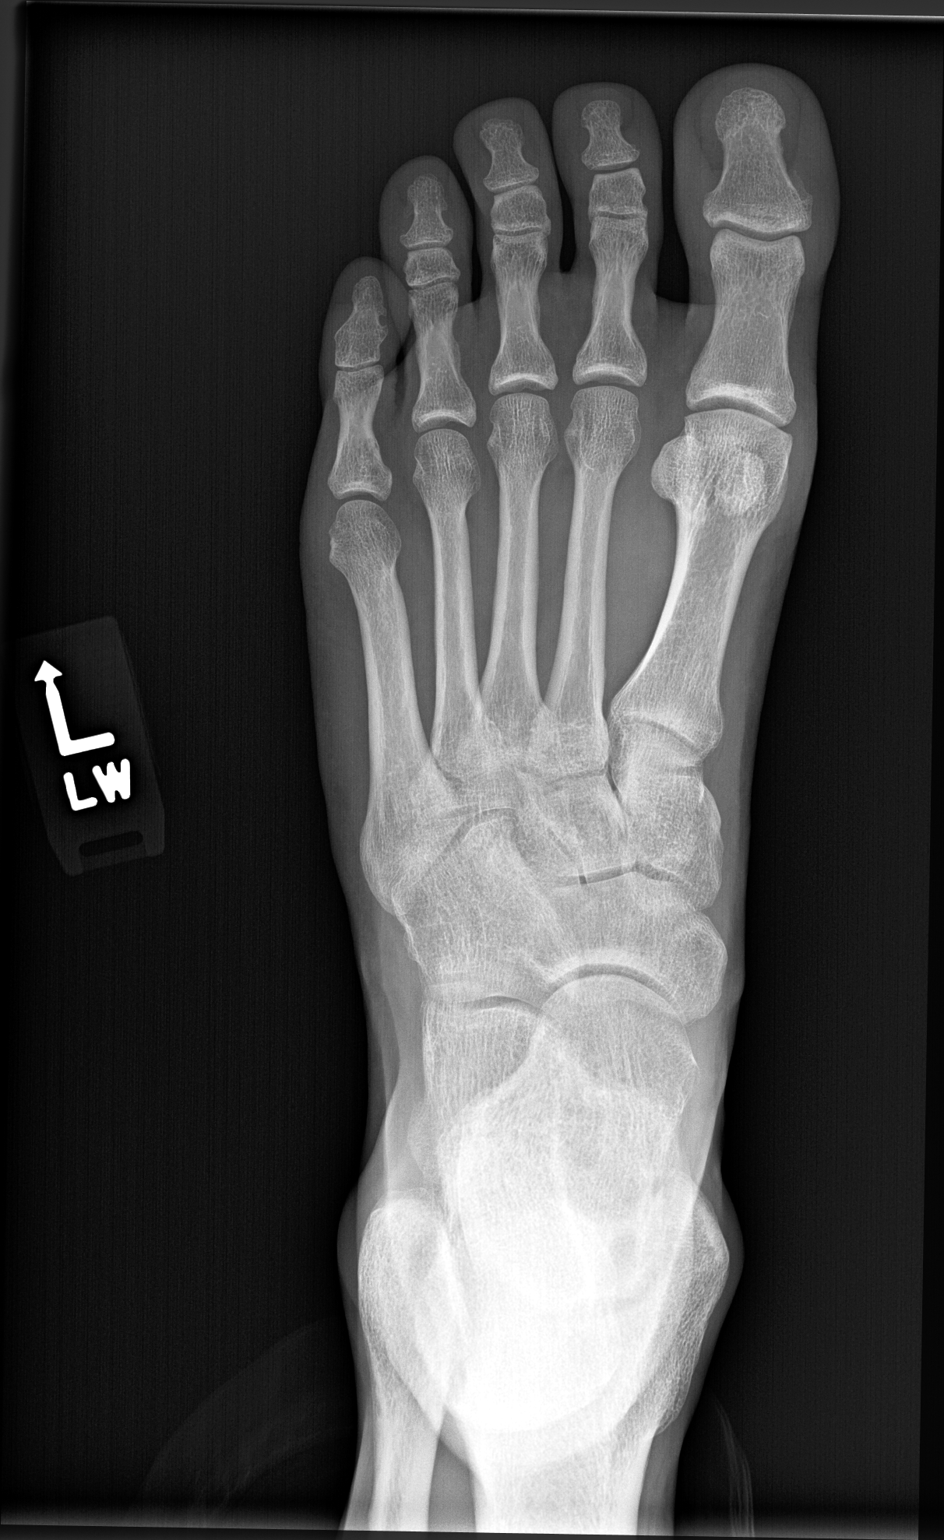

[foot obl (oblique)]
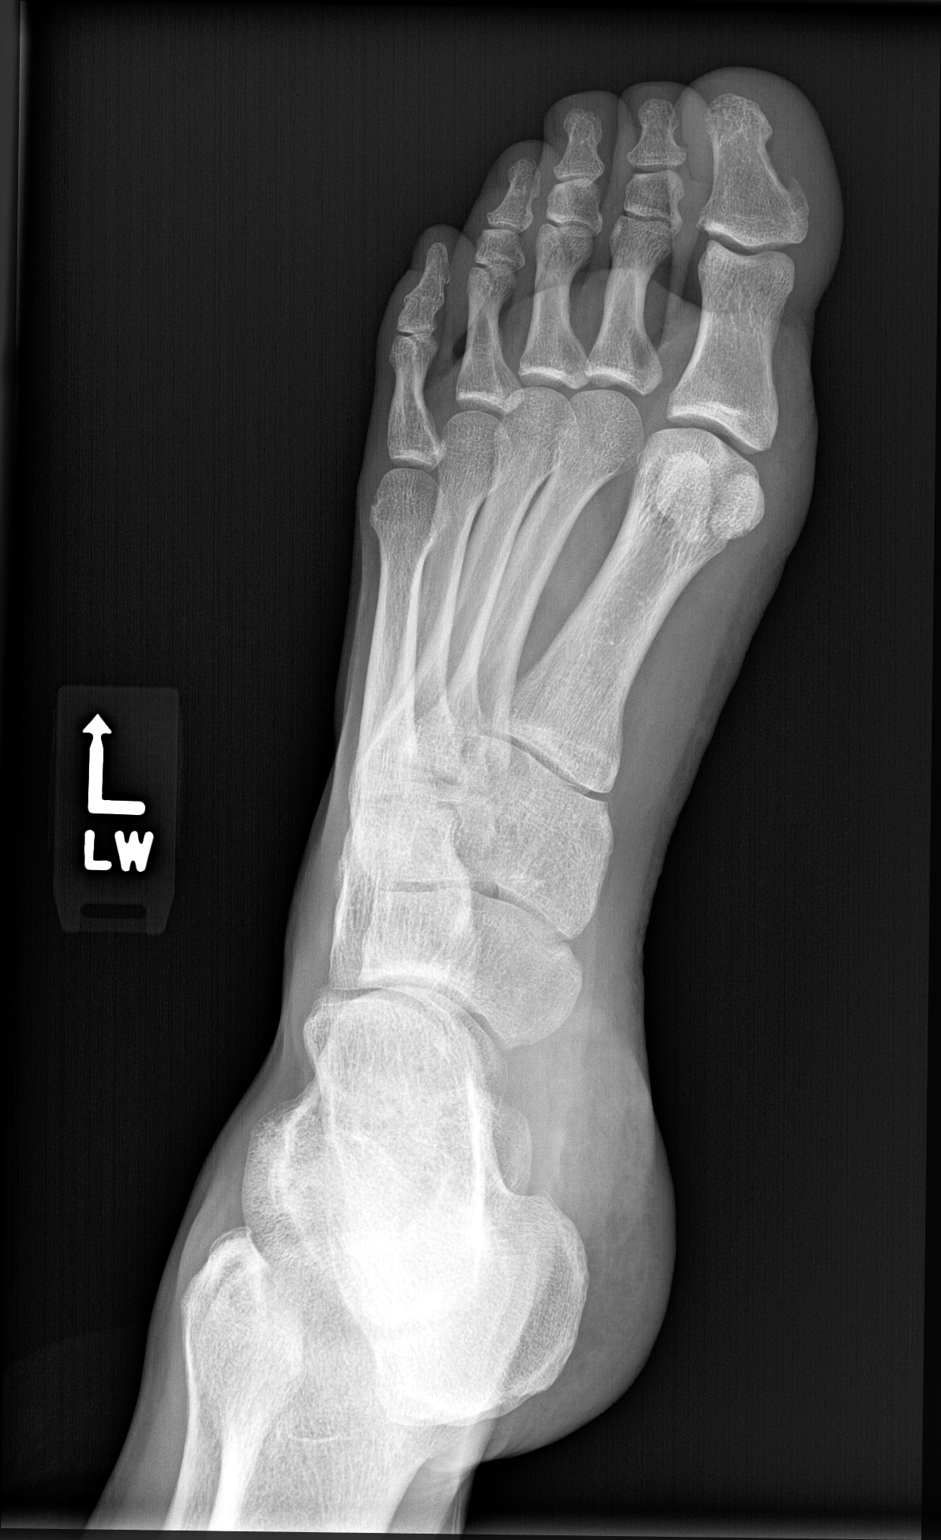

[foot lat]
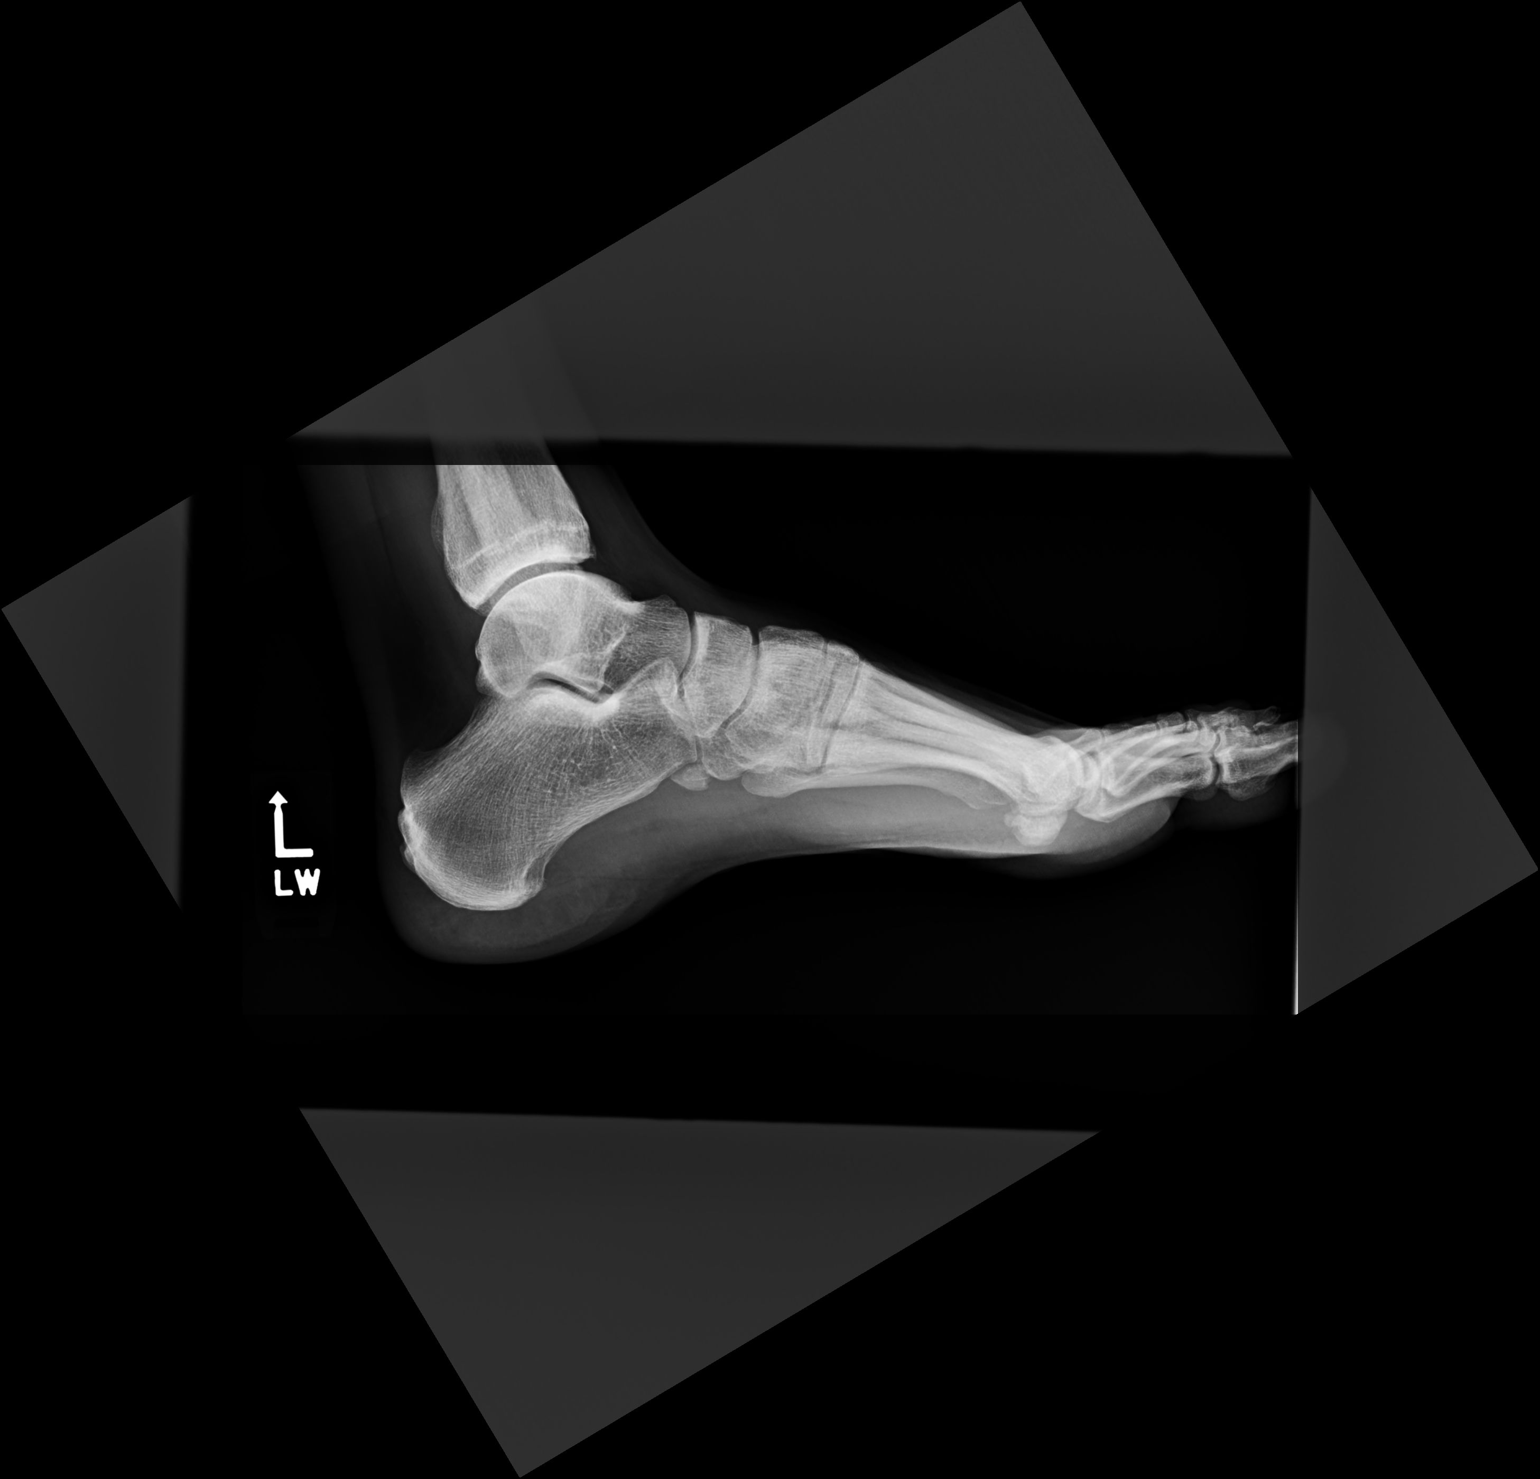

[3 of 3 positions shown; findings below may reference images not displayed]

FINDINGS: There is no evidence of fracture or dislocation. There is no
evidence of arthropathy or other focal bone abnormality. Soft
tissues are unremarkable.
IMPRESSION: Negative exam.  No radiopaque foreign body is seen.

## 2020-10-11 ENCOUNTER — Other Ambulatory Visit: Payer: Self-pay

## 2020-10-12 ENCOUNTER — Ambulatory Visit (INDEPENDENT_AMBULATORY_CARE_PROVIDER_SITE_OTHER): Payer: 59 | Admitting: Internal Medicine

## 2020-10-12 ENCOUNTER — Encounter: Payer: Self-pay | Admitting: Internal Medicine

## 2020-10-12 ENCOUNTER — Other Ambulatory Visit: Payer: Self-pay

## 2020-10-12 VITALS — BP 124/80 | HR 80 | Temp 97.4°F | Ht 70.0 in | Wt 174.6 lb

## 2020-10-12 DIAGNOSIS — Z1329 Encounter for screening for other suspected endocrine disorder: Secondary | ICD-10-CM

## 2020-10-12 DIAGNOSIS — Z1389 Encounter for screening for other disorder: Secondary | ICD-10-CM

## 2020-10-12 DIAGNOSIS — Z0000 Encounter for general adult medical examination without abnormal findings: Secondary | ICD-10-CM | POA: Diagnosis not present

## 2020-10-12 DIAGNOSIS — T148XXA Other injury of unspecified body region, initial encounter: Secondary | ICD-10-CM

## 2020-10-12 DIAGNOSIS — R030 Elevated blood-pressure reading, without diagnosis of hypertension: Secondary | ICD-10-CM

## 2020-10-12 MED ORDER — MUPIROCIN CALCIUM 2 % EX CREA: 1.0000 "application " | TOPICAL_CREAM | Freq: Three times a day (TID) | CUTANEOUS | 0 refills | Status: DC

## 2020-10-12 NOTE — Patient Instructions (Addendum)
covid card please   Hypertension, Adult High blood pressure (hypertension) is when the force of blood pumping through the arteries is too strong. The arteries are the blood vessels that carry blood from the heart throughout the body. Hypertension forces the heart to work harder to pump blood and may cause arteries to become narrow or stiff. Untreated or uncontrolled hypertension can cause a heart attack, heart failure, a stroke, kidney disease, and other problems. A blood pressure reading consists of a higher number over a lower number. Ideally, your blood pressure should be below 120/80. The first ("top") number is called the systolic pressure. It is a measure of the pressure in your arteries as your heart beats. The second ("bottom") number is called the diastolic pressure. It is a measure of the pressure in your arteries as the heart relaxes. What are the causes? The exact cause of this condition is not known. There are some conditions that result in or are related to high blood pressure. What increases the risk? Some risk factors for high blood pressure are under your control. The following factors may make you more likely to develop this condition:  Smoking.  Having type 2 diabetes mellitus, high cholesterol, or both.  Not getting enough exercise or physical activity.  Being overweight.  Having too much fat, sugar, calories, or salt (sodium) in your diet.  Drinking too much alcohol. Some risk factors for high blood pressure may be difficult or impossible to change. Some of these factors include:  Having chronic kidney disease.  Having a family history of high blood pressure.  Age. Risk increases with age.  Race. You may be at higher risk if you are African American.  Gender. Men are at higher risk than women before age 84. After age 58, women are at higher risk than men.  Having obstructive sleep apnea.  Stress. What are the signs or symptoms? High blood pressure may not  cause symptoms. Very high blood pressure (hypertensive crisis) may cause:  Headache.  Anxiety.  Shortness of breath.  Nosebleed.  Nausea and vomiting.  Vision changes.  Severe chest pain.  Seizures. How is this diagnosed? This condition is diagnosed by measuring your blood pressure while you are seated, with your arm resting on a flat surface, your legs uncrossed, and your feet flat on the floor. The cuff of the blood pressure monitor will be placed directly against the skin of your upper arm at the level of your heart. It should be measured at least twice using the same arm. Certain conditions can cause a difference in blood pressure between your right and left arms. Certain factors can cause blood pressure readings to be lower or higher than normal for a short period of time:  When your blood pressure is higher when you are in a health care provider's office than when you are at home, this is called white coat hypertension. Most people with this condition do not need medicines.  When your blood pressure is higher at home than when you are in a health care provider's office, this is called masked hypertension. Most people with this condition may need medicines to control blood pressure. If you have a high blood pressure reading during one visit or you have normal blood pressure with other risk factors, you may be asked to:  Return on a different day to have your blood pressure checked again.  Monitor your blood pressure at home for 1 week or longer. If you are diagnosed with hypertension, you  may have other blood or imaging tests to help your health care provider understand your overall risk for other conditions. How is this treated? This condition is treated by making healthy lifestyle changes, such as eating healthy foods, exercising more, and reducing your alcohol intake. Your health care provider may prescribe medicine if lifestyle changes are not enough to get your blood pressure  under control, and if:  Your systolic blood pressure is above 130.  Your diastolic blood pressure is above 80. Your personal target blood pressure may vary depending on your medical conditions, your age, and other factors. Follow these instructions at home: Eating and drinking  Eat a diet that is high in fiber and potassium, and low in sodium, added sugar, and fat. An example eating plan is called the DASH (Dietary Approaches to Stop Hypertension) diet. To eat this way: ? Eat plenty of fresh fruits and vegetables. Try to fill one half of your plate at each meal with fruits and vegetables. ? Eat whole grains, such as whole-wheat pasta, brown rice, or whole-grain bread. Fill about one fourth of your plate with whole grains. ? Eat or drink low-fat dairy products, such as skim milk or low-fat yogurt. ? Avoid fatty cuts of meat, processed or cured meats, and poultry with skin. Fill about one fourth of your plate with lean proteins, such as fish, chicken without skin, beans, eggs, or tofu. ? Avoid pre-made and processed foods. These tend to be higher in sodium, added sugar, and fat.  Reduce your daily sodium intake. Most people with hypertension should eat less than 1,500 mg of sodium a day.  Do not drink alcohol if: ? Your health care provider tells you not to drink. ? You are pregnant, may be pregnant, or are planning to become pregnant.  If you drink alcohol: ? Limit how much you use to:  0-1 drink a day for women.  0-2 drinks a day for men. ? Be aware of how much alcohol is in your drink. In the [place] one drink equals one 12 oz bottle of beer (355 mL), one 5 oz glass of wine (148 mL), or one 1 oz glass of hard liquor (44 mL).   Lifestyle  Work with your health care provider to maintain a healthy body weight or to lose weight. Ask what an ideal weight is for you.  Get at least 30 minutes of exercise most days of the week. Activities may include walking, swimming, or biking.  Include  exercise to strengthen your muscles (resistance exercise), such as Pilates or lifting weights, as part of your weekly exercise routine. Try to do these types of exercises for 30 minutes at least 3 days a week.  Do not use any products that contain nicotine or tobacco, such as cigarettes, e-cigarettes, and chewing tobacco. If you need help quitting, ask your health care provider.  Monitor your blood pressure at home as told by your health care provider.  Keep all follow-up visits as told by your health care provider. This is important.   Medicines  Take over-the-counter and prescription medicines only as told by your health care provider. Follow directions carefully. Blood pressure medicines must be taken as prescribed.  Do not skip doses of blood pressure medicine. Doing this puts you at risk for problems and can make the medicine less effective.  Ask your health care provider about side effects or reactions to medicines that you should watch for. Contact a health care provider if you:  Think you are  having a reaction to a medicine you are taking.  Have headaches that keep coming back (recurring).  Feel dizzy.  Have swelling in your ankles.  Have trouble with your vision. Get help right away if you:  Develop a severe headache or confusion.  Have unusual weakness or numbness.  Feel faint.  Have severe pain in your chest or abdomen.  Vomit repeatedly.  Have trouble breathing. Summary  Hypertension is when the force of blood pumping through your arteries is too strong. If this condition is not controlled, it may put you at risk for serious complications.  Your personal target blood pressure may vary depending on your medical conditions, your age, and other factors. For most people, a normal blood pressure is less than 120/80.  Hypertension is treated with lifestyle changes, medicines, or a combination of both. Lifestyle changes include losing weight, eating a healthy,  low-sodium diet, exercising more, and limiting alcohol. This information is not intended to replace advice given to you by your health care provider. Make sure you discuss any questions you have with your health care provider. Document Revised: 04/15/2018 Document Reviewed: 04/15/2018 Elsevier Patient Education  2021 Elsevier Inc.  How to Take Your Blood Pressure Blood pressure is a measurement of how strongly your blood is pressing against the walls of your arteries. Arteries are blood vessels that carry blood from your heart throughout your body. Your health care provider takes your blood pressure at each office visit. You can also take your own blood pressure at home with a blood pressure monitor. You may need to take your own blood pressure to:  Confirm a diagnosis of high blood pressure (hypertension).  Monitor your blood pressure over time.  Make sure your blood pressure medicine is working. Supplies needed:  Blood pressure monitor.  Dining room chair to sit in.  Table or desk.  Small notebook and pencil or pen. How to prepare To get the most accurate reading, avoid the following for 30 minutes before you check your blood pressure:  Drinking caffeine.  Drinking alcohol.  Eating.  Smoking.  Exercising. Five minutes before you check your blood pressure:  Use the bathroom and urinate so that you have an empty bladder.  Sit quietly in a dining room chair. Do not sit in a soft couch or an armchair. Do not talk. How to take your blood pressure To check your blood pressure, follow the instructions in the manual that came with your blood pressure monitor. If you have a digital blood pressure monitor, the instructions may be as follows: 1. Sit up straight in a chair. 2. Place your feet on the floor. Do not cross your ankles or legs. 3. Rest your left arm at the level of your heart on a table or desk or on the arm of a chair. 4. Pull up your shirt sleeve. 5. Wrap the blood  pressure cuff around the upper part of your left arm, 1 inch (2.5 cm) above your elbow. It is best to wrap the cuff around bare skin. 6. Fit the cuff snugly around your arm. You should be able to place only one finger between the cuff and your arm. 7. Position the cord so that it rests in the bend of your elbow. 8. Press the power button. 9. Sit quietly while the cuff inflates and deflates. 10. Read the digital reading on the monitor screen and write the numbers down (record them) in a notebook. 11. Wait 2-3 minutes, then repeat the steps, starting at  step 1.   What does my blood pressure reading mean? A blood pressure reading consists of a higher number over a lower number. Ideally, your blood pressure should be below 120/80. The first ("top") number is called the systolic pressure. It is a measure of the pressure in your arteries as your heart beats. The second ("bottom") number is called the diastolic pressure. It is a measure of the pressure in your arteries as the heart relaxes. Blood pressure is classified into five stages. The following are the stages for adults who do not have a short-term serious illness or a chronic condition. Systolic pressure and diastolic pressure are measured in a unit called mm Hg (millimeters of mercury).  Normal  Systolic pressure: below 120.  Diastolic pressure: below 80. Elevated  Systolic pressure: 120-129.  Diastolic pressure: below 80. Hypertension stage 1  Systolic pressure: 130-139.  Diastolic pressure: 80-89. Hypertension stage 2  Systolic pressure: 140 or above.  Diastolic pressure: 90 or above. You can have elevated blood pressure or hypertension even if only the systolic or only the diastolic number in your reading is higher than normal. Follow these instructions at home:  Check your blood pressure as often as recommended by your health care provider.  Check your blood pressure at the same time every day.  Take your monitor to the next  appointment with your health care provider to make sure that: ? You are using it correctly. ? It provides accurate readings.  Be sure you understand what your goal blood pressure numbers are.  Tell your health care provider if you are having any side effects from blood pressure medicine.  Keep all follow-up visits as told by your health care provider. This is important. General tips  Your health care provider can suggest a reliable monitor that will meet your needs. There are several types of home blood pressure monitors.  Choose a monitor that has an arm cuff. Do not choose a monitor that measures your blood pressure from your wrist or finger.  Choose a cuff that wraps snugly around your upper arm. You should be able to fit only one finger between your arm and the cuff.  You can buy a blood pressure monitor at most drugstores or online. Where to find more information American Heart Association: www.heart.org Contact a health care provider if:  Your blood pressure is consistently high. Get help right away if:  Your systolic blood pressure is higher than 180.  Your diastolic blood pressure is higher than 120. Summary  Blood pressure is a measurement of how strongly your blood is pressing against the walls of your arteries.  A blood pressure reading consists of a higher number over a lower number. Ideally, your blood pressure should be below 120/80.  Check your blood pressure at the same time every day.  Avoid caffeine, alcohol, smoking, and exercise for 30 minutes prior to checking your blood pressure. These agents can affect the accuracy of the blood pressure reading. This information is not intended to replace advice given to you by your health care provider. Make sure you discuss any questions you have with your health care provider. Document Revised: 07/30/2019 Document Reviewed: 07/30/2019 Elsevier Patient Education  2021 Elsevier Inc.  Eye Floaters Eye floaters are  spots in your vision caused by shadows from specks of material that float around inside your eye. This is usually a normal, age-related change in your eye. Floaters may be more obvious when you look up at the sky or at  a bright, blank background. Floaters can be annoying, but they do not usually cause vision problems. While floaters may be a normal part of aging, it is important to get an eye exam to make sure that floaters are not a sign of a more serious condition. What are the causes? In most cases, this condition is caused by age-related changes in the eye. As you age, the jelly-like fluid (vitreous) inside the eyeball shrinks and can become stringy. The stringy strands of vitreous cast shadows on the back of the eye (retina), which is where the nerves needed for vision are located. These shadows show up as floaters in your vision. Other possible causes of eye floaters include:  A torn retina.  Injury.  Bleeding inside the eye. Diabetes and other conditions can cause blood vessels in the retina to bleed.  A blood clot in the major vein of the retina or its branches (retinal vein occlusion).  Separation (detachment) of the: ? Retina. ? Vitreous.  Eye inflammation (uveitis).  Eye infection. What increases the risk? You may have a higher risk for floaters if you:  Are older. The risk increases with age.  Are nearsighted.  Have diabetes.  Have had cataracts removed. What are the signs or symptoms? Floaters cause you to see small, shadowy shapes move across your vision. The shapes move as your eyes move. They drift out of your vision when you keep your eyes still. These shapes may look like:  Specks.  Dots.  Circles.  Squiggly lines.  Thread. Sometimes floaters appear along with flashes. Flashes usually occur at the edge of your vision. They may look like:  Bursts of light.  Flashing lights.  Lightning streaks.  What is commonly referred to as "stars." In some  cases, seeing flashes can be a sign of a torn or detached retina, which is a serious condition that requires emergency treatment.   How is this diagnosed? Your health care provider may diagnose floaters and flashes based on your symptoms and medical history. An eye care specialist (ophthalmologist) will do an eye exam to determine whether your floaters are a normal part of aging or a warning sign of a more serious eye problem. The specialist may put drops in your eyes to open your pupils wide (dilate) and then use a scope (slit lamp) to look inside your eye. If the specialist is unable to see well enough with a slit lamp, you may need imaging tests such as ultrasound. How is this treated?  If your floaters are the result of aging, you do not need treatment unless they start to affect your vision. Floaters do not go away completely. Most people adapt to the floaters or, over time, the floaters settle below the line of sight.  If a retinal tear or detachment is causing your floaters, you may need surgery.  If you have an underlying condition that is causing floaters, such as an infection, the floaters should go away when that condition is treated.  In rare cases, if the floaters are affecting your vision, surgery to remove the vitreous and replace it with a saltwater solution (vitrectomy) may be considered. Follow these instructions at home:  Take over-the-counter and prescription medicines only as told by your health care provider.  Do not drive if you have trouble seeing. Ask your health care provider for guidance about when it is and is not safe for you to drive.  Keep all follow-up visits as told by your health care provider. This is  important. Contact a health care provider if you:  Have more floaters than usual.  Develop any new symptoms. Get help right away if:  You cannot see well because of your floaters.  You develop flashes along with floaters.  Your vision suddenly changes,  or you lose your vision completely.  It looks as if a curtain is blocking part of your vision. Summary  Eye floaters are spots in your vision caused by specks of material that float around inside your eye.  In most cases, eye floaters are caused by age-related changes in the eye.  Most people do not need treatment for eye floaters unless there is another condition causing the floaters, such as a retinal tear or detachment or an infection. This information is not intended to replace advice given to you by your health care provider. Make sure you discuss any questions you have with your health care provider. Document Revised: 04/06/2020 Document Reviewed: 04/06/2020 Elsevier Patient Education  2021 [employer]

## 2020-10-12 NOTE — Progress Notes (Signed)
Chief Complaint  Patient presents with  . Annual Exam   Annual  BP elevated on and repeat normal FH HTN dad and brother  B/l arms and hands scratches from his puppy   Review of Systems  Constitutional: Negative for weight loss.  HENT: Negative for hearing loss.   Eyes: Negative for blurred vision.  Respiratory: Negative for shortness of breath.   Cardiovascular: Negative for chest pain.  Gastrointestinal: Negative for abdominal pain.  Musculoskeletal: Negative for falls and joint pain.  Skin: Negative for rash.  Neurological: Negative for headaches.  Psychiatric/Behavioral: Negative for depression.   Past Medical History:  Diagnosis Date  . Disc degeneration, lumbar   . History of chicken pox    Past Surgical History:  Procedure Laterality Date  . BACK SURGERY  2017   2016, 2017 ruptured disc x 2   . SKIN BIOPSY     x 2 normal ~2016 chest, near right axilla normal    Family History  Problem Relation Age of Onset  . Hypertension Father   . Heart attack Maternal Grandfather   . Stroke Paternal Grandfather   . Heart attack Paternal Grandfather   . Heart disease Paternal Grandfather   . Hypertension Brother   . Cancer Neg Hx    Social History   Socioeconomic History  . Marital status: Married    Spouse name: Not on file  . Number of children: Not on file  . Years of education: 12  . Highest education level: Not on file  Occupational History  . Occupation: Arts administrator: [employer]  Tobacco Use  . Smoking status: Current Some Day Smoker    Types: Cigars  . Smokeless tobacco: Never Used  Vaping Use  . Vaping Use: Never used  Substance and Sexual Activity  . Alcohol use: Yes    Alcohol/week: 6.0 standard drinks    Types: 6 Standard drinks or equivalent per week    Comment: weekly  . Drug use: No  . Sexual activity: Yes  Other Topics Concern  . Not on file  Social History Narrative   Regular exercise-yes   Caffeine Use-yes      45 y.o  daughter as of 10/12/19    Married [place] wife    Social Determinants of Health   Financial Resource Strain: Not on file  Food Insecurity: Not on file  Transportation Needs: Not on file  Physical Activity: Not on file  Stress: Not on file  Social Connections: Not on file  Intimate Partner Violence: Not on file   Current Meds  Medication Sig  . Multiple Vitamin (MULTIVITAMIN) tablet Take 1 tablet by mouth daily.  . mupirocin cream (BACTROBAN) 2 % Apply 1 application topically 3 (three) times daily.   No Known Allergies No results found for this or any previous visit (from the past 2160 hour(s)). Objective  Body mass index is 25.05 kg/m. Wt Readings from Last 3 Encounters:  10/12/20 174 lb 9.6 oz (79.2 kg)  10/12/19 177 lb 12.8 oz (80.6 kg)  09/14/18 175 lb 3.2 oz (79.5 kg)   Temp Readings from Last 3 Encounters:  10/12/20 (!) 97.4 F (36.3 C) (Oral)  10/12/19 97.7 F (36.5 C) (Skin)  09/14/18 98.2 F (36.8 C) (Oral)   BP Readings from Last 3 Encounters:  10/12/20 124/80  10/12/19 116/84  09/14/18 110/84   Pulse Readings from Last 3 Encounters:  10/12/20 80  10/12/19 95  09/14/18 77    Physical Exam Vitals and nursing  note reviewed.  Constitutional:      Appearance: Normal appearance. He is well-developed, well-groomed and overweight.  HENT:     Head: Normocephalic and atraumatic.  Eyes:     Conjunctiva/sclera: Conjunctivae normal.     Pupils: Pupils are equal, round, and reactive to light.  Cardiovascular:     Rate and Rhythm: Normal rate and regular rhythm.     Heart sounds: Normal heart sounds. No murmur heard.   Pulmonary:     Effort: Pulmonary effort is normal.     Breath sounds: Normal breath sounds.  Abdominal:     Tenderness: There is no abdominal tenderness.  Skin:    General: Skin is warm and dry.  Neurological:     General: No focal deficit present.     Mental Status: He is alert and oriented to person, place, and time. Mental status is at  baseline.     Gait: Gait normal.  Psychiatric:        Attention and Perception: Attention and perception normal.        Mood and Affect: Mood and affect normal.        Speech: Speech normal.        Behavior: Behavior normal. Behavior is cooperative.        Thought Content: Thought content normal.        Cognition and Memory: Cognition and memory normal.        Judgment: Judgment normal.     Assessment  Plan  Annual physical exam - Plan: Comprehensive metabolic panel, Lipid panel, CBC w/Diff, TSH, Urinalysis, Routine w reflex microscopic-[facility]  Declines flu shot  tdap 44/15/16  covid 2/2 pfizer not had booster consider Fasting labs [facility] Colonoscopy screening age 44-44  PSA consider baseline age 45  Skin 2016 check and bx x 2 normal per pt [facility] Dr. Kirtland Bouchard rec healthy diet and exercise  Established with [facility]    Elevated blood pressure reading - Plan: Comprehensive metabolic panel, Lipid panel, CBC w/Diff Monitor BP given log let me know BP in 2 weeks  Abrasion scratches to arms/hands from puppy - Plan: mupirocin cream (BACTROBAN) 2 %    Provider: Dr. French Ana McLean-Scocuzza-[facility] Medicine

## 2021-07-01 ENCOUNTER — Telehealth: Payer: 59 | Admitting: Physician Assistant

## 2021-07-01 DIAGNOSIS — J111 Influenza due to unidentified influenza virus with other respiratory manifestations: Secondary | ICD-10-CM | POA: Diagnosis not present

## 2021-07-01 MED ORDER — OSELTAMIVIR PHOSPHATE 75 MG PO CAPS: 75.0000 mg | ORAL_CAPSULE | Freq: Two times a day (BID) | ORAL | 0 refills | Status: DC

## 2021-07-01 NOTE — Progress Notes (Signed)
E visit for Flu like symptoms   We are sorry that you are not feeling well.  Here is how we plan to help! Based on what you have shared with me it looks like you may have a respiratory virus that may be influenza.  Influenza or "the flu" is   an infection caused by a respiratory virus. The flu virus is highly contagious and persons who did not receive their yearly flu vaccination may "catch" the flu from close contact.  We have anti-viral medications to treat the viruses that cause this infection. They are not a "cure" and only shorten the course of the infection. These prescriptions are most effective when they are given within the first 2 days of "flu" symptoms. Antiviral medication are indicated if you have a high risk of complications from the flu. You should  also consider an antiviral medication if you are in close contact with someone who is at risk. These medications can help patients avoid complications from the flu  but have side effects that you should know. Possible side effects from Tamiflu or oseltamivir include nausea, vomiting, diarrhea, dizziness, headaches, eye redness, sleep problems or other respiratory symptoms. You should not take Tamiflu if you have an allergy to oseltamivir or any to the ingredients in Tamiflu.  Based upon your symptoms and potential risk factors I have prescribed Oseltamivir (Tamiflu).  It has been sent to your designated pharmacy.  You will take one 75 mg capsule orally twice a day for the next 5 days.  ANYONE WHO HAS FLU SYMPTOMS SHOULD: Stay home. The flu is highly contagious and going out or to work exposes others! Be sure to drink plenty of fluids. Water is fine as well as fruit juices, sodas and electrolyte beverages. You may want to stay away from caffeine or alcohol. If you are nauseated, try taking small sips of liquids. How do you know if you are getting enough fluid? Your urine should be a pale yellow or almost colorless. Get rest. Taking a steamy  shower or using a humidifier may help nasal congestion and ease sore throat pain. Using a saline nasal spray works much the same way. Cough drops, hard candies and sore throat lozenges may ease your cough. Line up a caregiver. Have someone check on you regularly.   GET HELP RIGHT AWAY IF: You cannot keep down liquids or your medications. You become short of breath Your fell like you are going to pass out or loose consciousness. Your symptoms persist after you have completed your treatment plan MAKE SURE YOU  Understand these instructions. Will watch your condition. Will get help right away if you are not doing well or get worse.  Your e-visit answers were reviewed by a board certified advanced clinical practitioner to complete your personal care plan.  Depending on the condition, your plan could have included both over the counter or prescription medications.  If there is a problem please reply  once you have received a response from your provider.  Your safety is important to us.  If you have drug allergies check your prescription carefully.    You can use MyChart to ask questions about today's visit, request a non-urgent call back, or ask for a work or school excuse for 24 hours related to this e-Visit. If it has been greater than 24 hours you will need to follow up with your provider, or enter a new e-Visit to address those concerns.  You will get an e-mail in the next   two days asking about your experience.  I hope that your e-visit has been valuable and will speed your recovery. Thank you for using e-visits.  I provided 5 minutes of non face-to-face time during this encounter for chart review and documentation.   

## 2021-10-16 ENCOUNTER — Ambulatory Visit (INDEPENDENT_AMBULATORY_CARE_PROVIDER_SITE_OTHER): Payer: 59 | Admitting: Internal Medicine

## 2021-10-16 ENCOUNTER — Encounter: Payer: Self-pay | Admitting: Internal Medicine

## 2021-10-16 ENCOUNTER — Other Ambulatory Visit: Payer: Self-pay

## 2021-10-16 VITALS — BP 120/80 | HR 83 | Temp 98.3°F | Ht 71.22 in | Wt 175.0 lb

## 2021-10-16 DIAGNOSIS — Z3009 Encounter for other general counseling and advice on contraception: Secondary | ICD-10-CM

## 2021-10-16 DIAGNOSIS — Z0000 Encounter for general adult medical examination without abnormal findings: Secondary | ICD-10-CM

## 2021-10-16 LAB — LIPID PANEL
Cholesterol: 123 mg/dL (ref 0–200)
HDL: 61.2 mg/dL (ref >39.00)
LDL Cholesterol: 55 mg/dL (ref 0–99)
NonHDL: 61.95
Total CHOL/HDL Ratio: 2
Triglycerides: 34 mg/dL (ref 0.0–149.0)
VLDL: 6.8 mg/dL (ref 0.0–40.0)

## 2021-10-16 LAB — COMPREHENSIVE METABOLIC PANEL
ALT: 74 U/L — ABNORMAL HIGH (ref 0–53)
AST: 54 U/L — ABNORMAL HIGH (ref 0–37)
Albumin: 4.6 g/dL (ref 3.5–5.2)
Alkaline Phosphatase: 76 U/L (ref 39–117)
BUN: 16 mg/dL (ref 6–23)
CO2: 30 mEq/L (ref 19–32)
Calcium: 9.4 mg/dL (ref 8.4–10.5)
Chloride: 102 mEq/L (ref 96–112)
Creatinine, Ser: 0.79 mg/dL (ref 0.40–1.50)
GFR: 108.29 mL/min (ref >60.00)
Glucose, Bld: 96 mg/dL (ref 70–99)
Potassium: 4.5 mEq/L (ref 3.5–5.1)
Sodium: 139 mEq/L (ref 135–145)
Total Bilirubin: 1 mg/dL (ref 0.2–1.2)
Total Protein: 7.1 g/dL (ref 6.0–8.3)

## 2021-10-16 LAB — CBC WITH DIFFERENTIAL/PLATELET
Basophils Absolute: 0 10*3/uL (ref 0.0–0.1)
Basophils Relative: 0.7 % (ref 0.0–3.0)
Eosinophils Absolute: 0.1 10*3/uL (ref 0.0–0.7)
Eosinophils Relative: 1.4 % (ref 0.0–5.0)
HCT: 43.8 % (ref 39.0–52.0)
Hemoglobin: 14.7 g/dL (ref 13.0–17.0)
Lymphocytes Relative: 33.7 % (ref 12.0–46.0)
Lymphs Abs: 1.5 10*3/uL (ref 0.7–4.0)
MCHC: 33.6 g/dL (ref 30.0–36.0)
MCV: 93 fl (ref 78.0–100.0)
Monocytes Absolute: 0.4 10*3/uL (ref 0.1–1.0)
Monocytes Relative: 8.5 % (ref 3.0–12.0)
Neutro Abs: 2.4 10*3/uL (ref 1.4–7.7)
Neutrophils Relative %: 55.7 % (ref 43.0–77.0)
Platelets: 277 10*3/uL (ref 150.0–400.0)
RBC: 4.71 Mil/uL (ref 4.22–5.81)
RDW: 13.6 % (ref 11.5–15.5)
WBC: 4.4 10*3/uL (ref 4.0–10.5)

## 2021-10-16 LAB — URINALYSIS, ROUTINE W REFLEX MICROSCOPIC
Bilirubin Urine: NEGATIVE
Hgb urine dipstick: NEGATIVE
Ketones, ur: NEGATIVE
Leukocytes,Ua: NEGATIVE
Nitrite: NEGATIVE
RBC / HPF: NONE SEEN (ref 0–?)
Specific Gravity, Urine: 1.01 (ref 1.000–1.030)
Total Protein, Urine: NEGATIVE
Urine Glucose: NEGATIVE
Urobilinogen, UA: 0.2 (ref 0.0–1.0)
pH: 7 (ref 5.0–8.0)

## 2021-10-16 LAB — TSH: TSH: 2.22 u[IU]/mL (ref 0.35–5.50)

## 2021-10-16 NOTE — Progress Notes (Signed)
Chief Complaint  Patient presents with   Annual Exam   Annual  Of note had granola bar   Review of Systems  Constitutional:  Negative for weight loss.  HENT:  Negative for hearing loss.   Eyes:  Negative for blurred vision.  Respiratory:  Negative for shortness of breath.   Cardiovascular:  Negative for chest pain.  Gastrointestinal:  Negative for abdominal pain and blood in stool.  Genitourinary:  Negative for dysuria.  Musculoskeletal:  Negative for falls and joint pain.  Skin:  Negative for rash.  Neurological:  Negative for headaches.  Psychiatric/Behavioral:  Negative for depression.   Past Medical History:  Diagnosis Date   Disc degeneration, lumbar    History of chicken pox    Past Surgical History:  Procedure Laterality Date   BACK SURGERY  2017   2016, 2017 ruptured disc x 2    SKIN BIOPSY     x 2 normal ~2016 chest, near right axilla normal    Family History  Problem Relation Age of Onset   Hypertension Father    Heart attack Maternal Grandfather    Stroke Paternal Grandfather    Heart attack Paternal Grandfather    Heart disease Paternal Grandfather    Hypertension Brother    Cancer Neg Hx    Social History   Socioeconomic History   Marital status: Married    Spouse name: Not on file   Number of children: Not on file   Years of education: 16   Highest education level: Not on file  Occupational History   Occupation: Airline pilot: [employer]  Tobacco Use   Smoking status: Former    Types: Cigars   Smokeless tobacco: Never  Vaping Use   Vaping Use: Never used  Substance and Sexual Activity   Alcohol use: Yes    Alcohol/week: 6.0 standard drinks    Types: 6 Standard drinks or equivalent per week    Comment: weekly   Drug use: No   Sexual activity: Yes  Other Topics Concern   Not on file  Social History Narrative   Regular exercise-yes   Caffeine Use-yes      37 y.o daughter as of 10/12/19    Married [place] wife     Social Determinants of Health   Financial Resource Strain: Not on file  Food Insecurity: Not on file  Transportation Needs: Not on file  Physical Activity: Not on file  Stress: Not on file  Social Connections: Not on file  Intimate Partner Violence: Not on file   Current Meds  Medication Sig   Multiple Vitamin (MULTIVITAMIN) tablet Take 1 tablet by mouth daily.   No Known Allergies No results found for this or any previous visit (from the past 2160 hour(s)). Objective  Body mass index is 24.26 kg/m. Wt Readings from Last 3 Encounters:  10/16/21 175 lb (79.4 kg)  10/12/20 174 lb 9.6 oz (79.2 kg)  10/12/19 177 lb 12.8 oz (80.6 kg)   Temp Readings from Last 3 Encounters:  10/16/21 98.3 F (36.8 C) (Oral)  10/12/20 (!) 97.4 F (36.3 C) (Oral)  10/12/19 97.7 F (36.5 C) (Skin)   BP Readings from Last 3 Encounters:  10/16/21 120/80  10/12/20 124/80  10/12/19 116/84   Pulse Readings from Last 3 Encounters:  10/16/21 83  10/12/20 80  10/12/19 95    Physical Exam Vitals and nursing note reviewed.  Constitutional:      Appearance: Normal appearance. He is well-developed and  well-groomed.  HENT:     Head: Normocephalic and atraumatic.  Eyes:     Conjunctiva/sclera: Conjunctivae normal.     Pupils: Pupils are equal, round, and reactive to light.  Cardiovascular:     Rate and Rhythm: Normal rate and regular rhythm.     Heart sounds: Normal heart sounds.  Pulmonary:     Effort: Pulmonary effort is normal. No respiratory distress.     Breath sounds: Normal breath sounds.  Abdominal:     Tenderness: There is no abdominal tenderness.  Skin:    General: Skin is warm and moist.  Neurological:     General: No focal deficit present.     Mental Status: He is alert and oriented to person, place, and time. Mental status is at baseline.     Sensory: Sensation is intact.     Motor: Motor function is intact.     Coordination: Coordination is intact.     Gait: Gait is  intact. Gait normal.  Psychiatric:        Attention and Perception: Attention and perception normal.        Mood and Affect: Mood and affect normal.        Speech: Speech normal.        Behavior: Behavior normal. Behavior is cooperative.        Thought Content: Thought content normal.        Cognition and Memory: Cognition and memory normal.        Judgment: Judgment normal.    Assessment  Plan  Annual physical exam - Plan: Comprehensive metabolic panel, Lipid panel, CBC with Differential/Platelet, TSH, Urinalysis, Routine w reflex microscopic See below   Vasectomy evaluation  Will refer when ready   Declines flu shot  tdap 03/03/15  covid 2/2 pfizer not had booster consider Colonoscopy screening age 68 PSA consider baseline age 74 Skin 2016 check and bx x 2 normal per pt [facility] Dr. Raliegh Ip Declines hep C/hIV rec healthy diet and exercise  Established with  eye  Will refer GI referral 07/2022, urology referral Dr. Diamantina Providence   Provider: Dr. Olivia Mackie McLean-[facility]

## 2021-10-16 NOTE — Patient Instructions (Addendum)
Dr. Vira Agar   Phone Fax E-mail Address  [phone] Not available [address]   [place] [address]   Call back when needed for referral  Vasectomy-Dr. Richardo Hanks   My chart me when ready for referral  Vasectomy is a procedure in which the vas deferens is cut and then tied or burned (cauterized). The vas deferens is a tube that carries sperm from the testicle to the part of the body that drains urine from the bladder (urethra). This procedure blocks sperm from going through the vas deferens and penis during ejaculation. This ensures that sperm does not go into the vagina during sex. Vasectomy does not affect sexual desire or performance and does not prevent sexually transmitted infections. Vasectomy is considered a permanent and very effective form of birth control (contraception). The decision to have a vasectomy should not be made during a stressful time, such as after the loss of a pregnancy or a divorce. You and your partner should decide on whether to have a vasectomy when you are sure that you do not want children in the future. Tell a health care provider about: Any allergies you have. All medicines you are taking, including vitamins, herbs, eye drops, creams, and over-the-counter medicines. Any problems you or family members have had with anesthetic medicines. Any blood disorders you have. Any surgeries you have had. Any medical conditions you have. What are the risks? Generally, this is a safe procedure. However, problems may occur, including: Infection. Bleeding and swelling of the scrotum. The scrotum is the sac that contains the testicles, blood vessels, and structures that help deliver sperm and semen. Allergic reactions to medicines. Failure of the procedure to prevent pregnancy. There is a very small chance that the tied or cauterized ends of the vas deferens may reconnect (recanalization). If this happens, you could still make a woman  pregnant. Pain in the scrotum that continues after you heal from the procedure. What happens before the procedure? Medicines Ask your health care provider about: Changing or stopping your regular medicines. This is especially important if you are taking diabetes medicines or blood thinners. Taking medicines such as aspirin and ibuprofen. These medicines can thin your blood. Do not take these medicines unless your health care provider tells you to take them. Taking over-the-counter medicines, vitamins, herbs, and supplements. You may be told to take a medicine to help you relax (sedative) a few hours before the procedure. General instructions Do not use any products that contain nicotine or tobacco for at least 4 weeks before the procedure. These products include cigarettes, e-cigarettes, and chewing tobacco. If you need help quitting, ask your health care provider. Plan to have a responsible adult take you home from the hospital or clinic. If you will be going home right after the procedure, plan to have a responsible adult care for you for the time you are told. This is important. Ask your health care provider: How your surgery site will be marked. What steps will be taken to help prevent infection. These steps may include: Removing hair at the surgery site. Washing skin with a germ-killing soap. Taking antibiotic medicine. What happens during the procedure?  You will be given one or more of the following: A sedative, unless you were told to take this a few hours before the procedure. A medicine to numb the area (local anesthetic). Your health care provider will feel, or palpate, for your vas deferens. To reach the vas deferens, one of two methods may be  used: A very small incision may be made in your scrotum. A punctured opening may be made in your scrotum, without an incision. Your vas deferens will be pulled out of your scrotum and cut. Then, the vas deferens will be closed in one of  two ways: Tied at the ends. Cauterized at the ends to seal them off. The vas deferens will be put back into your scrotum. The incision or puncture opening will be closed with absorbable stitches (sutures). The sutures will eventually dissolve and will not need to be removed after the procedure. The procedure will be repeated on the other side of your scrotum. The procedure may vary among health care providers and hospitals. What happens after the procedure? You will be monitored to make sure that you do not have problems. You will be asked not to ejaculate for at least 1 week after the procedure, or for as long as you are told. You will need to use a different form of contraception for 2-4 months after the procedure, until you have test results confirming that there are no sperm in your semen. You may be given scrotal support to wear, such as a jockstrap or underwear with a supportive pouch. If you were given a sedative during the procedure, it can affect you for several hours. Do not drive or operate machinery until your health care provider says that it is safe. Summary Vasectomy blocks sperm from being released during ejaculation. This procedure is considered a permanent and very effective form of birth control. Your scrotum will be numbed with medicine (local anesthetic) for the procedure. After the procedure, you will be asked not to ejaculate for at least 1 week, or for as long as you are told. You will also need to use a different form of contraception until your test results confirm that there are no sperm in your semen. This information is not intended to replace advice given to you by your health care provider. Make sure you discuss any questions you have with your health care provider. Document Revised: 12/23/2019 Document Reviewed: 12/23/2019 Elsevier Patient Education  2022 [employer].

## 2021-10-18 ENCOUNTER — Other Ambulatory Visit: Payer: Self-pay | Admitting: Internal Medicine

## 2021-10-18 DIAGNOSIS — R748 Abnormal levels of other serum enzymes: Secondary | ICD-10-CM

## 2021-10-18 NOTE — Progress Notes (Signed)
Patient informed and verbalized understanding.   Patient states that he does drink occasionally. 3-4 times a week with dinner.  Patient declines imaging at this point unless he is at risk.  Patient sates to call him back if he absolutely needs the ultrasound.

## 2022-02-06 DIAGNOSIS — D2262 Melanocytic nevi of left upper limb, including shoulder: Secondary | ICD-10-CM | POA: Diagnosis not present

## 2022-02-06 DIAGNOSIS — D225 Melanocytic nevi of trunk: Secondary | ICD-10-CM | POA: Diagnosis not present

## 2022-02-06 DIAGNOSIS — L814 Other melanin hyperpigmentation: Secondary | ICD-10-CM | POA: Diagnosis not present

## 2022-02-06 DIAGNOSIS — D2261 Melanocytic nevi of right upper limb, including shoulder: Secondary | ICD-10-CM | POA: Diagnosis not present

## 2022-02-06 DIAGNOSIS — R208 Other disturbances of skin sensation: Secondary | ICD-10-CM | POA: Diagnosis not present

## 2022-04-18 ENCOUNTER — Ambulatory Visit: Payer: 59 | Admitting: Dermatology

## 2022-08-10 ENCOUNTER — Telehealth: Payer: 59 | Admitting: Physician Assistant

## 2022-08-10 DIAGNOSIS — Z20828 Contact with and (suspected) exposure to other viral communicable diseases: Secondary | ICD-10-CM | POA: Diagnosis not present

## 2022-08-10 MED ORDER — OSELTAMIVIR PHOSPHATE 75 MG PO CAPS: 75.0000 mg | ORAL_CAPSULE | Freq: Every day | ORAL | 0 refills | Status: DC

## 2022-08-10 NOTE — Progress Notes (Signed)
E visit for Flu like symptoms   We are sorry that you are not feeling well.  Here is how we plan to help! Based on what you have shared with me it looks like you may have possible exposure to a virus that causes influenza.  Influenza or "the flu" is   an infection caused by a respiratory virus. The flu virus is highly contagious and persons who did not receive their yearly flu vaccination may "catch" the flu from close contact.  We have anti-viral medications to treat the viruses that cause this infection. They are not a "cure" and only shorten the course of the infection. These prescriptions are most effective when they are given within the first 2 days of "flu" symptoms. Antiviral medication are indicated if you have a high risk of complications from the flu. You should  also consider an antiviral medication if you are in close contact with someone who is at risk. These medications can help patients avoid complications from the flu  but have side effects that you should know. Possible side effects from Tamiflu or oseltamivir include nausea, vomiting, diarrhea, dizziness, headaches, eye redness, sleep problems or other respiratory symptoms. You should not take Tamiflu if you have an allergy to oseltamivir or any to the ingredients in Tamiflu.  Based upon your symptoms and potential risk factors I have prescribed Oseltamivir (Tamiflu).  It has been sent to your designated pharmacy.  You will take one 75 mg capsule orally once a day for the next 10 days for prophylaxis against influenza.  ANYONE WHO HAS FLU SYMPTOMS SHOULD: Stay home. The flu is highly contagious and going out or to work exposes others! Be sure to drink plenty of fluids. Water is fine as well as fruit juices, sodas and electrolyte beverages. You may want to stay away from caffeine or alcohol. If you are nauseated, try taking small sips of liquids. How do you know if you are getting enough fluid? Your urine should be a pale yellow or  almost colorless. Get rest. Taking a steamy shower or using a humidifier may help nasal congestion and ease sore throat pain. Using a saline nasal spray works much the same way. Cough drops, hard candies and sore throat lozenges may ease your cough. Line up a caregiver. Have someone check on you regularly.   GET HELP RIGHT AWAY IF: You cannot keep down liquids or your medications. You become short of breath Your fell like you are going to pass out or loose consciousness. Your symptoms persist after you have completed your treatment plan MAKE SURE YOU  Understand these instructions. Will watch your condition. Will get help right away if you are not doing well or get worse.  Your e-visit answers were reviewed by a board certified advanced clinical practitioner to complete your personal care plan.  Depending on the condition, your plan could have included both over the counter or prescription medications.  If there is a problem please reply  once you have received a response from your provider.  Your safety is important to [place].  If you have drug allergies check your prescription carefully.    You can use MyChart to ask questions about today's visit, request a non-urgent call back, or ask for a work or school excuse for 24 hours related to this e-Visit. If it has been greater than 24 hours you will need to follow up with your provider, or enter a new e-Visit to address those concerns.  You will get  an e-mail in the next two days asking about your experience.  I hope that your e-visit has been valuable and will speed your recovery. Thank you for using e-visits.  I have spent 5 minutes in review of e-visit questionnaire, review and updating patient chart, medical decision making and response to patient.   Mar Daring, PA-C

## 2022-10-17 ENCOUNTER — Encounter: Payer: 59 | Admitting: Internal Medicine

## 2022-11-25 ENCOUNTER — Emergency Department
Admission: EM | Admit: 2022-11-25 | Discharge: 2022-11-25 | Disposition: A | Discharge status: Home / Self Care | Payer: 59 | Attending: Emergency Medicine | Admitting: Emergency Medicine

## 2022-11-25 ENCOUNTER — Emergency Department: Payer: 59

## 2022-11-25 DIAGNOSIS — W091XXA Fall from playground swing, initial encounter: Secondary | ICD-10-CM | POA: Insufficient documentation

## 2022-11-25 DIAGNOSIS — S4991XA Unspecified injury of right shoulder and upper arm, initial encounter: Secondary | ICD-10-CM | POA: Diagnosis not present

## 2022-11-25 DIAGNOSIS — S43004A Unspecified dislocation of right shoulder joint, initial encounter: Secondary | ICD-10-CM

## 2022-11-25 DIAGNOSIS — S43101A Unspecified dislocation of right acromioclavicular joint, initial encounter: Secondary | ICD-10-CM | POA: Insufficient documentation

## 2022-11-25 DIAGNOSIS — M25511 Pain in right shoulder: Secondary | ICD-10-CM | POA: Diagnosis not present

## 2022-11-25 MED ORDER — IBUPROFEN 400 MG PO TABS
ORAL_TABLET | ORAL | Status: AC
  Filled 2022-11-25: qty 1

## 2022-11-25 MED ORDER — MELOXICAM 15 MG PO TABS: 15.0000 mg | ORAL_TABLET | Freq: Every day | ORAL | 0 refills | Status: AC

## 2022-11-25 MED ORDER — IBUPROFEN 400 MG PO TABS
400.0000 mg | ORAL_TABLET | Freq: Once | ORAL | Status: AC
  Administered 2022-11-25: 400 mg via ORAL

## 2022-11-25 MED ORDER — MELOXICAM 7.5 MG PO TABS
15.0000 mg | ORAL_TABLET | Freq: Once | ORAL | Status: AC
  Administered 2022-11-25: 15 mg via ORAL
  Filled 2022-11-25: qty 2

## 2022-11-25 MED ORDER — HYDROCODONE-ACETAMINOPHEN 5-325 MG PO TABS
1.0000 | ORAL_TABLET | Freq: Once | ORAL | Status: AC
  Administered 2022-11-25: 1 via ORAL
  Filled 2022-11-25: qty 1

## 2022-11-25 MED ORDER — IBUPROFEN 600 MG PO TABS: 600.0000 mg | ORAL_TABLET | Freq: Once | ORAL | Status: DC

## 2022-11-25 MED ORDER — HYDROCODONE-ACETAMINOPHEN 5-325 MG PO TABS: 1.0000 | ORAL_TABLET | ORAL | 0 refills | Status: AC | PRN

## 2022-11-25 NOTE — ED Notes (Signed)
Shoulder immobilizer applied to R shoulder.

## 2022-11-25 NOTE — ED Triage Notes (Signed)
Pt to ED via POV from home. Pt reports fell out of a hammock playing with his daughter and is now having right shoulder pain.

## 2022-11-25 NOTE — ED Provider Notes (Signed)
[facility] Provider Note  Patient Contact: 9:52 PM (approximate)   History   Shoulder Pain   HPI  Colton Morris is a 47 y.o. male who presents the emergency department after a fall onto his right shoulder.  Patient was on a hammock with his daughter and they were swinging.  Hammock saw little too aggressively and the daughter and the patient fell out.  Patient tried to protect his daughter and landed directly on his right shoulder.  Did not hit his head or lose consciousness.  Denies any other injury or complaint.  Patient felt a popping sensation in his shoulder and has a deformity along his anterior/lateral shoulder region.     Physical Exam   Triage Vital Signs: ED Triage Vitals [11/25/22 1828]  Enc Vitals Group     BP (!) 123/90     Pulse Rate 85     Resp 18     Temp 98 F (36.7 C)     Temp Source Oral     SpO2 98 %     Weight      Height      Head Circumference      Peak Flow      Pain Score 9     Pain Loc      Pain Edu?      Excl. in GC?     Most recent vital signs: Vitals:   11/25/22 1828  BP: (!) 123/90  Pulse: 85  Resp: 18  Temp: 98 F (36.7 C)  SpO2: 98%     General: Alert and in no acute distress. Head: No acute traumatic findings  Neck: No stridor. No cervical spine tenderness to palpation  Cardiovascular:  Good peripheral perfusion Respiratory: Normal respiratory effort without tachypnea or retractions. Lungs CTAB. Musculoskeletal: Full range of motion to all extremities.  Visualization of the right shoulder reveals a what appears to be deformity along the lateral aspect of the clavicle.  Palpation reveals deformity over the [facility] joint.  Tenderness in this area.  Limited range of motion due to pain.  Pulses sensation intact distally. Neurologic:  No gross focal neurologic deficits are appreciated.  Skin:   No rash noted Other:   ED Results / Procedures / Treatments   Labs (all labs ordered are listed, but  only abnormal results are displayed) Labs Reviewed - No data to display   EKG     RADIOLOGY  I personally viewed, evaluated, and interpreted these images as part of my medical decision making, as well as reviewing the written report by the radiologist.  ED Provider Interpretation: Findings consistent with [facility] shoulder separation with widening of the acromioclavicular joint space.  DG Shoulder Right  Result Date: 11/25/2022 CLINICAL DATA:  Right shoulder pain after fall EXAM: RIGHT SHOULDER - 3 VIEW COMPARISON:  None Available. FINDINGS: There is widening of the acromioclavicular joint, measuring up to 16 mm. No evidence of acute fracture. The glenohumeral joint is intact. The visualized right lung is clear. Soft tissues are unremarkable. IMPRESSION: Widening of the acromioclavicular joint, concerning for dislocation and/or ligamentous injury. No acute fracture. Electronically Signed   By: Jacob Moores M.D.   On: 11/25/2022 18:52    PROCEDURES:  Critical Care performed: No  Procedures   MEDICATIONS ORDERED IN ED: Medications  ibuprofen (ADVIL) tablet 400 mg (400 mg Oral Given 11/25/22 1833)  meloxicam (MOBIC) tablet 15 mg (15 mg Oral Given 11/25/22 2125)  HYDROcodone-acetaminophen (NORCO/VICODIN) 5-325 MG per tablet 1  tablet (1 tablet Oral Given 11/25/22 2125)     IMPRESSION / MDM / ASSESSMENT AND PLAN / ED COURSE  I reviewed the triage vital signs and the nursing notes.                                 Differential diagnosis includes, but is not limited to, shoulder separation, clavicle fracture, shoulder dislocation  Patient's presentation is most consistent with acute presentation with potential threat to life or bodily function.   Patient's diagnosis is consistent with shoulder separation.  Patient presents emergency department after landing on his right shoulder.  Patient landed directly on the shoulder and had a deformity visualized.  Findings are consistent with fracture  versus shoulder separation.  Imaging confirms separation of the acromioclavicular joint.  Will place the patient on pain medication, anti-inflammatory and refer to orthopedics for further management.  Sling is in place at this time.  Return precautions discussed with the patient. Patient is given ED precautions to return to the ED for any worsening or new symptoms.     FINAL CLINICAL IMPRESSION(S) / ED DIAGNOSES   Final diagnoses:  Shoulder separation, right, initial encounter     Rx / DC Orders   ED Discharge Orders          Ordered    HYDROcodone-acetaminophen (NORCO/VICODIN) 5-325 MG tablet  Every 4 hours PRN        11/25/22 2155    meloxicam (MOBIC) 15 MG tablet  Daily        11/25/22 2155             Note:  This document was prepared using Dragon voice recognition software and may include unintentional dictation errors.   Lanette Hampshire 11/25/22 2157    Corena Herter, MD 11/26/22 [phone]

## 2022-12-06 DIAGNOSIS — S4351XA Sprain of right acromioclavicular joint, initial encounter: Secondary | ICD-10-CM | POA: Diagnosis not present

## 2023-01-27 DIAGNOSIS — S4351XA Sprain of right acromioclavicular joint, initial encounter: Secondary | ICD-10-CM | POA: Diagnosis not present

## 2023-04-24 DIAGNOSIS — M7542 Impingement syndrome of left shoulder: Secondary | ICD-10-CM | POA: Diagnosis not present

## 2023-10-06 ENCOUNTER — Telehealth: Payer: 59 | Admitting: Physician Assistant

## 2023-10-06 DIAGNOSIS — R6889 Other general symptoms and signs: Secondary | ICD-10-CM | POA: Diagnosis not present

## 2023-10-06 MED ORDER — OSELTAMIVIR PHOSPHATE 75 MG PO CAPS: 75.0000 mg | ORAL_CAPSULE | Freq: Two times a day (BID) | ORAL | 0 refills | Status: DC

## 2023-10-06 NOTE — Progress Notes (Signed)
 E visit for Flu like symptoms   We are sorry that you are not feeling well.  Here is how we plan to help! Based on what you have shared with me it looks like you may have a respiratory virus that may be influenza.  Influenza or "the flu" is   an infection caused by a respiratory virus. The flu virus is highly contagious and persons who did not receive their yearly flu vaccination may "catch" the flu from close contact.  We have anti-viral medications to treat the viruses that cause this infection. They are not a "cure" and only shorten the course of the infection. These prescriptions are most effective when they are given within the first 2 days of "flu" symptoms. Antiviral medication are indicated if you have a high risk of complications from the flu. You should  also consider an antiviral medication if you are in close contact with someone who is at risk. These medications can help patients avoid complications from the flu  but have side effects that you should know. Possible side effects from Tamiflu or oseltamivir include nausea, vomiting, diarrhea, dizziness, headaches, eye redness, sleep problems or other respiratory symptoms. You should not take Tamiflu if you have an allergy to oseltamivir or any to the ingredients in Tamiflu.  Based upon your symptoms and potential risk factors I have prescribed Oseltamivir (Tamiflu).  It has been sent to your designated pharmacy.  You will take one 75 mg capsule orally twice a day for the next 5 days.  ANYONE WHO HAS FLU SYMPTOMS SHOULD: Stay home. The flu is highly contagious and going out or to work exposes others! Be sure to drink plenty of fluids. Water is fine as well as fruit juices, sodas and electrolyte beverages. You may want to stay away from caffeine or alcohol. If you are nauseated, try taking small sips of liquids. How do you know if you are getting enough fluid? Your urine should be a pale yellow or almost colorless. Get rest. Taking a steamy  shower or using a humidifier may help nasal congestion and ease sore throat pain. Using a saline nasal spray works much the same way. Cough drops, hard candies and sore throat lozenges may ease your cough. Line up a caregiver. Have someone check on you regularly.   GET HELP RIGHT AWAY IF: You cannot keep down liquids or your medications. You become short of breath Your fell like you are going to pass out or loose consciousness. Your symptoms persist after you have completed your treatment plan MAKE SURE YOU  Understand these instructions. Will watch your condition. Will get help right away if you are not doing well or get worse.  Your e-visit answers were reviewed by a board certified advanced clinical practitioner to complete your personal care plan.  Depending on the condition, your plan could have included both over the counter or prescription medications.  If there is a problem please reply  once you have received a response from your provider.  Your safety is important to [place].  If you have drug allergies check your prescription carefully.    You can use MyChart to ask questions about today's visit, request a non-urgent call back, or ask for a work or school excuse for 24 hours related to this e-Visit. If it has been greater than 24 hours you will need to follow up with your provider, or enter a new e-Visit to address those concerns.  You will get an e-mail in the next  two days asking about your experience.  I hope that your e-visit has been valuable and will speed your recovery. Thank you for using e-visits.  I have spent 5 minutes in review of e-visit questionnaire, review and updating patient chart, medical decision making and response to patient.   Margaretann Loveless, PA-C

## 2024-03-10 DIAGNOSIS — L720 Epidermal cyst: Secondary | ICD-10-CM | POA: Diagnosis not present

## 2024-03-10 DIAGNOSIS — D2261 Melanocytic nevi of right upper limb, including shoulder: Secondary | ICD-10-CM | POA: Diagnosis not present

## 2024-03-10 DIAGNOSIS — D2262 Melanocytic nevi of left upper limb, including shoulder: Secondary | ICD-10-CM | POA: Diagnosis not present

## 2024-03-10 DIAGNOSIS — L821 Other seborrheic keratosis: Secondary | ICD-10-CM | POA: Diagnosis not present

## 2024-03-10 DIAGNOSIS — D225 Melanocytic nevi of trunk: Secondary | ICD-10-CM | POA: Diagnosis not present

## 2024-03-10 DIAGNOSIS — D2271 Melanocytic nevi of right lower limb, including hip: Secondary | ICD-10-CM | POA: Diagnosis not present

## 2024-03-10 DIAGNOSIS — L538 Other specified erythematous conditions: Secondary | ICD-10-CM | POA: Diagnosis not present

## 2024-03-10 DIAGNOSIS — D2272 Melanocytic nevi of left lower limb, including hip: Secondary | ICD-10-CM | POA: Diagnosis not present

## 2024-03-10 DIAGNOSIS — L309 Dermatitis, unspecified: Secondary | ICD-10-CM | POA: Diagnosis not present

## 2024-08-14 ENCOUNTER — Telehealth: Admitting: Nurse Practitioner

## 2024-08-14 DIAGNOSIS — M791 Myalgia, unspecified site: Secondary | ICD-10-CM

## 2024-08-14 DIAGNOSIS — R6889 Other general symptoms and signs: Secondary | ICD-10-CM

## 2024-08-14 MED ORDER — OSELTAMIVIR PHOSPHATE 75 MG PO CAPS: 75.0000 mg | ORAL_CAPSULE | Freq: Two times a day (BID) | ORAL | 0 refills | Status: AC

## 2024-08-14 NOTE — Progress Notes (Signed)
 E visit for Flu like symptoms   We are sorry that you are not feeling well.  Here is how we plan to help! Based on what you have shared with me it looks like you may have possible exposure to a virus that causes influenza.  Influenza or the flu is  an infection caused by a respiratory virus. The flu virus is highly contagious and persons who did not receive their yearly flu vaccination may catch the flu from close contact.  We have anti-viral medications to treat the viruses that cause this infection. They are not a cure and only shorten the course of the infection. These prescriptions are most effective when they are given within the first 2 days of flu symptoms. Antiviral medications are indicated if you have a high risk of complications from the flu. You should  also consider an antiviral medication if you are in close contact with someone who is at risk. These medications can help patients avoid complications from the flu but have side effects that you should know.   Possible side effects from Tamiflu  or oseltamivir  include nausea, vomiting, diarrhea, dizziness, headaches, eye redness, sleep problems or other respiratory symptoms. You should not take Tamiflu  if you have an allergy to oseltamivir  or any to the ingredients in Tamiflu .  Based upon your symptoms and potential risk factors I have prescribed Oseltamivir  (Tamiflu ).  It has been sent to your designated pharmacy.  You will take one 75 mg capsule orally twice a day for the next 5 days.   For nasal congestion, you may use an oral decongestant such as Mucinex D or if you have glaucoma or high blood pressure use plain Mucinex.  Saline nasal spray or nasal drops can help and can safely be used as often as needed for congestion.  If you have a sore or scratchy throat, use a saltwater gargle-  to  teaspoon of salt dissolved in a 4-ounce to 8-ounce glass of warm water.  Gargle the solution for approximately 15-30 seconds and then spit.   It is important not to swallow the solution.  You can also use throat lozenges/cough drops and Chloraseptic spray to help with throat pain or discomfort.  Warm or cold liquids can also be helpful in relieving throat pain.  For headache, pain or general discomfort, you can use Ibuprofen  or Tylenol  as directed.   Some authorities believe that zinc sprays or the use of Echinacea may shorten the course of your symptoms.  You are to isolate at home until you have been fever-free for at least 24 hours without a fever-reducing medication, and symptoms have been steadily improving for 24 hours.  If you must be around other household members who do not have symptoms, you need to make sure that both you and the family members are masking consistently with a high-quality mask.  If you note any worsening of symptoms despite treatment, please seek an in-person evaluation ASAP. If you note any significant shortness of breath or any chest pain, please seek ED evaluation. Please do not delay care!  ANYONE WHO HAS FLU SYMPTOMS SHOULD: Stay home. The flu is highly contagious and going out or to work exposes others! Be sure to drink plenty of fluids. Water is fine as well as fruit juices, sodas and electrolyte beverages. You may want to stay away from caffeine or alcohol. If you are nauseated, try taking small sips of liquids. How do you know if you are getting enough fluid? Your urine should be  a pale yellow or almost colorless. Get rest. Taking a steamy shower or using a humidifier may help nasal congestion and ease sore throat pain. Using a saline nasal spray works much the same way. Cough drops, hard candies and sore throat lozenges may ease your cough. Line up a caregiver. Have someone check on you regularly.  GET HELP RIGHT AWAY IF: You cannot keep down liquids or your medications. You become short of breath Your fell like you are going to pass out or loose consciousness. Your symptoms persist after you  have completed your treatment plan  MAKE SURE YOU  Understand these instructions. Will watch your condition. Will get help right away if you are not doing well or get worse.  Your e-visit answers were reviewed by a board certified advanced clinical practitioner to complete your personal care plan.  Depending on the condition, your plan could have included both over the counter or prescription medications.  If there is a problem please reply  once you have received a response from your provider.  Your safety is important to us .  If you have drug allergies check your prescription carefully.    You can use MyChart to ask questions about todays visit, request a non-urgent call back, or ask for a work or school excuse for 24 hours related to this e-Visit. If it has been greater than 24 hours you will need to follow up with your provider, or enter a new e-Visit to address those concerns.  You will get an e-mail in the next two days asking about your experience.  I hope that your e-visit has been valuable and will speed your recovery. Thank you for using e-visits.   I have spent 5 minutes in review of e-visit questionnaire, review and updating patient chart, medical decision making and response to patient.   Katurah Karapetian W Alicen Donalson, NP
